# Patient Record
Sex: Female | Born: 2000 | Race: Black or African American | Hispanic: No | Marital: Single | Smoking: Never smoker
Health system: Southern US, Community
[De-identification: ages and names within clinical notes are randomized; demographics above are authoritative.]

## PROBLEM LIST (undated history)

## (undated) DIAGNOSIS — J45909 Unspecified asthma, uncomplicated: Secondary | ICD-10-CM

---

## 2020-01-17 ENCOUNTER — Ambulatory Visit (HOSPITAL_COMMUNITY)
Admission: EM | Admit: 2020-01-17 | Discharge: 2020-01-17 | Disposition: A | Discharge status: Home / Self Care | Payer: Commercial Managed Care - PPO | Attending: Family Medicine | Admitting: Family Medicine

## 2020-01-17 ENCOUNTER — Encounter (HOSPITAL_COMMUNITY): Payer: Self-pay | Admitting: Family Medicine

## 2020-01-17 ENCOUNTER — Other Ambulatory Visit: Payer: Self-pay

## 2020-01-17 DIAGNOSIS — R519 Headache, unspecified: Secondary | ICD-10-CM

## 2020-01-17 DIAGNOSIS — S0083XA Contusion of other part of head, initial encounter: Secondary | ICD-10-CM | POA: Diagnosis not present

## 2020-01-17 MED ORDER — IBUPROFEN 800 MG PO TABS: 800.0000 mg | ORAL_TABLET | Freq: Three times a day (TID) | ORAL | 0 refills | Status: DC

## 2020-01-17 MED ORDER — CYCLOBENZAPRINE HCL 5 MG PO TABS: 5.0000 mg | ORAL_TABLET | Freq: Every day | ORAL | 0 refills | Status: DC

## 2020-01-17 NOTE — Discharge Instructions (Signed)
I expect the headache and facial swelling to last about 5 days.  It should be getting better tomorrow and gradually fade.  If you find that the headache is worsening, you will need a CAT scan.  This can only be done in the emergency department.
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
## 2020-03-31 ENCOUNTER — Other Ambulatory Visit: Payer: Self-pay

## 2020-03-31 ENCOUNTER — Emergency Department (HOSPITAL_COMMUNITY)
Admission: EM | Admit: 2020-03-31 | Discharge: 2020-04-01 | Disposition: A | Discharge status: Home / Self Care | Payer: Commercial Managed Care - PPO | Attending: Emergency Medicine | Admitting: Emergency Medicine

## 2020-03-31 ENCOUNTER — Encounter (HOSPITAL_COMMUNITY): Payer: Self-pay | Admitting: Emergency Medicine

## 2020-03-31 DIAGNOSIS — Z5321 Procedure and treatment not carried out due to patient leaving prior to being seen by health care provider: Secondary | ICD-10-CM | POA: Diagnosis not present

## 2020-03-31 DIAGNOSIS — R0602 Shortness of breath: Secondary | ICD-10-CM | POA: Diagnosis not present

## 2020-03-31 HISTORY — DX: Unspecified asthma, uncomplicated: J45.909

## 2020-03-31 MED ORDER — ALBUTEROL SULFATE (2.5 MG/3ML) 0.083% IN NEBU: 5.0000 mg | INHALATION_SOLUTION | Freq: Once | RESPIRATORY_TRACT | Status: DC

## 2020-03-31 NOTE — ED Triage Notes (Signed)
Pt brought to ED by co workers for asthma attack, pt reports using MDI x 3 pta. Pt hyperventilating on arrival, after pt was able to speak c/o fullness in head, LCTA.

## 2020-04-01 ENCOUNTER — Encounter (HOSPITAL_COMMUNITY): Payer: Self-pay

## 2020-04-01 ENCOUNTER — Ambulatory Visit (INDEPENDENT_AMBULATORY_CARE_PROVIDER_SITE_OTHER)
Admission: EM | Admit: 2020-04-01 | Discharge: 2020-04-01 | Disposition: A | Discharge status: Home / Self Care | Payer: Commercial Managed Care - PPO | Source: Home / Self Care

## 2020-04-01 ENCOUNTER — Encounter (HOSPITAL_COMMUNITY): Payer: Self-pay | Admitting: Emergency Medicine

## 2020-04-01 ENCOUNTER — Emergency Department (HOSPITAL_COMMUNITY)
Admission: EM | Admit: 2020-04-01 | Discharge: 2020-04-02 | Disposition: A | Discharge status: Home / Self Care | Payer: Commercial Managed Care - PPO | Source: Home / Self Care | Attending: Emergency Medicine | Admitting: Emergency Medicine

## 2020-04-01 ENCOUNTER — Other Ambulatory Visit: Payer: Self-pay

## 2020-04-01 DIAGNOSIS — J3089 Other allergic rhinitis: Secondary | ICD-10-CM | POA: Diagnosis not present

## 2020-04-01 DIAGNOSIS — J4520 Mild intermittent asthma, uncomplicated: Secondary | ICD-10-CM

## 2020-04-01 DIAGNOSIS — J45909 Unspecified asthma, uncomplicated: Secondary | ICD-10-CM

## 2020-04-01 MED ORDER — FLUTICASONE PROPIONATE 50 MCG/ACT NA SUSP: 2.0000 | Freq: Every day | NASAL | 2 refills | Status: AC

## 2020-04-01 MED ORDER — ALBUTEROL SULFATE HFA 108 (90 BASE) MCG/ACT IN AERS
2.0000 | INHALATION_SPRAY | Freq: Once | RESPIRATORY_TRACT | Status: AC
  Administered 2020-04-01: 2 via RESPIRATORY_TRACT
  Filled 2020-04-01: qty 6.7

## 2020-04-01 MED ORDER — PREDNISONE 20 MG PO TABS: 20.0000 mg | ORAL_TABLET | Freq: Every day | ORAL | 0 refills | Status: DC

## 2020-04-01 NOTE — ED Provider Notes (Signed)
[facility]    CSN: [id number] Arrival date & time: 04/01/20  1014      History   Chief Complaint Chief Complaint  Patient presents with   Allergies    HPI [facility] is a 19 y.o. female. who presents saying her allergies have been acting up in the past week, but she has not been taking her allergy meds. Then last night her asthma got worse and went to ER, but left without being seen since she had waiting a long time. She has used her Albuterol inhaler last night and this am and feels is not helping much. She denies fever, chills, sweats, CP. Does feel a little SOB and does have a productive cough with clear mucous.     Past Medical History:  Diagnosis Date   Asthma     There are no problems to display for this patient.   History reviewed. No pertinent surgical history.  OB History   No obstetric history on file.      Home Medications    Prior to Admission medications   Medication Sig Start Date End Date Taking? Authorizing Provider  cyclobenzaprine (FLEXERIL) 5 MG tablet Take 1 tablet (5 mg total) by mouth at bedtime. 01/17/20   Elvina Sidle, MD  fluticasone (FLONASE) 50 MCG/ACT nasal spray Place 2 sprays into both nostrils daily. 04/01/20 06/17/20  Rodriguez-Southworth, Nettie Elm, PA-C  ibuprofen (ADVIL) 800 MG tablet Take 1 tablet (800 mg total) by mouth 3 (three) times daily. 01/17/20   Elvina Sidle, MD  predniSONE (DELTASONE) 20 MG tablet Take 1 tablet (20 mg total) by mouth daily with breakfast. 04/01/20   Rodriguez-Southworth, Nettie Elm, PA-C    Family History History reviewed. No pertinent family history.  Social History Social History   Tobacco Use   Smoking status: Never Smoker   Smokeless tobacco: Never Used  Substance Use Topics   Alcohol use: Yes   Drug use: Yes    Types: Marijuana     Allergies   Patient has no known allergies.   Review of Systems Review of Systems  Constitutional: Positive for fatigue. Negative for  appetite change, chills, diaphoresis and fever.  HENT: Positive for congestion, postnasal drip, rhinorrhea, sinus pressure and sneezing. Negative for ear discharge, ear pain, sinus pain and sore throat.   Eyes: Positive for itching. Negative for discharge.       Her eye lids have been swelling  Respiratory: Positive for cough, shortness of breath and wheezing. Negative for chest tightness.   Cardiovascular: Negative for chest pain.  Musculoskeletal: Negative for arthralgias, gait problem and myalgias.  Skin: Negative for rash.  Allergic/Immunologic: Positive for environmental allergies.  Neurological: Negative for headaches.  Hematological: Negative for adenopathy.     Physical Exam Triage Vital Signs ED Triage Vitals [04/01/20 1029]  Enc Vitals Group     BP 112/62     Pulse Rate 81     Resp 16     Temp 98.2 F (36.8 C)     Temp Source Oral     SpO2 100 %     Weight      Height      Head Circumference      Peak Flow      Pain Score 0     Pain Loc      Pain Edu?      Excl. in GC?    No data found.  Updated Vital Signs BP 112/62 (BP Location: Right Arm)    Pulse 81  Temp 98.2 F (36.8 C) (Oral)    Resp 16    LMP 03/29/2020    SpO2 100%   Visual Acuity Right Eye Distance:   Left Eye Distance:   Bilateral Distance:    Right Eye Near:   Left Eye Near:    Bilateral Near:     Physical Exam Vitals and nursing note reviewed.  Constitutional:      General: She is not in acute distress.    Appearance: She is normal weight. She is not toxic-appearing.  HENT:     Head: Normocephalic.     Right Ear: Tympanic membrane, ear canal and external ear normal.     Left Ear: Tympanic membrane, ear canal and external ear normal.     Nose: Congestion and rhinorrhea present.     Mouth/Throat:     Mouth: Mucous membranes are moist.     Pharynx: Oropharynx is clear.  Eyes:     General: Scleral icterus present.        Right eye: No discharge.        Left eye: No discharge.      Extraocular Movements: Extraocular movements intact.     Conjunctiva/sclera: Conjunctivae normal.     Pupils: Pupils are equal, round, and reactive to light.     Comments: Her upper lids are a little swollen  Cardiovascular:     Rate and Rhythm: Normal rate and regular rhythm.  Pulmonary:     Effort: Pulmonary effort is normal.     Breath sounds: Normal breath sounds. No wheezing.  Musculoskeletal:        General: Normal range of motion.     Cervical back: Neck supple.  Lymphadenopathy:     Cervical: No cervical adenopathy.  Skin:    General: Skin is warm and dry.  Neurological:     Mental Status: She is alert and oriented to person, place, and time.     Gait: Gait normal.  Psychiatric:        Mood and Affect: Mood normal.        Behavior: Behavior normal.        Thought Content: Thought content normal.        Judgment: Judgment normal.      UC Treatments / Results  Labs (all labs ordered are listed, but only abnormal results are displayed) Labs Reviewed - No data to display  EKG   Radiology No results found.  Procedures Procedures (including critical care time)  Medications Ordered in UC Medications - No data to display  Initial Impression / Assessment and Plan / UC Course  I have reviewed the triage vital signs and the nursing notes. She was encouraged to get back on her allergy meds and explained that her allergies seems to be triggering her Asthma. I placed her on prednisone and she may continue her Albuterol inhaler q4h. I also placed her on Flonse.  FU prn Final Clinical Impressions(s) / UC Diagnoses   Final diagnoses:  Seasonal allergic rhinitis due to other allergic trigger  Mild asthma without complication, unspecified whether persistent     Discharge Instructions     Get back on your allergy medication today plus take what I am prescribing. Use your inhaler 2 puffs every 4 hours for 5-7 days, then as needed.  If you get worse in the next 72h, then  come back to be seen again.  Your allergies are triggering your asthma    ED Prescriptions    Medication Sig Dispense  Auth. Provider   predniSONE (DELTASONE) 20 MG tablet Take 1 tablet (20 mg total) by mouth daily with breakfast. 5 tablet Rodriguez-Southworth, Larie Mathes, PA-C   fluticasone (FLONASE) 50 MCG/ACT nasal spray Place 2 sprays into both nostrils daily. 18.2 g Rodriguez-Southworth, Nettie Elm, PA-C     PDMP not reviewed this encounter.   Garey Ham, PA-C 04/01/20 1130

## 2020-04-01 NOTE — ED Triage Notes (Signed)
Patient with asthma having problems breathing.  She states that she went to [facility] today and they told her that she was having some trouble due to her allergies.  Patient gave her meds for her allergies and asthma but she didn't take it because she hasn't eaten and she didn't want to vomit in her sleep.  Patient speaking in full sentences.

## 2020-04-01 NOTE — Discharge Instructions (Addendum)
Get back on your allergy medication today plus take what I am prescribing. Use your inhaler 2 puffs every 4 hours for 5-7 days, then as needed.  If you get worse in the next 72h, then come back to be seen again.  Your allergies are triggering your asthma

## 2020-04-01 NOTE — ED Triage Notes (Signed)
Pt presents today for "heavy head", dizziness, and eye puffiness. Pt also c/o sore throat. Pt endorses seasonal allergies. Pt denies taking allergy meds over the last month. Pt states "I still have them I just don' take them.". Pt denies other OTC remedies or relieving factors. Pt denies n/v/d, shortness of breath, fevers, chills, body aches.

## 2020-04-02 ENCOUNTER — Encounter (HOSPITAL_COMMUNITY): Payer: Self-pay | Admitting: Emergency Medicine

## 2020-04-02 NOTE — ED Notes (Signed)
Patient verbalizes understanding of discharge instructions. Opportunity for questioning and answers were provided. Armband removed by staff, pt discharged from ED. Pt. ambulatory and discharged home.  

## 2020-04-02 NOTE — ED Provider Notes (Signed)
[facility] Provider Note   CSN: [id number] Arrival date & time: 04/01/20  2313     History Chief Complaint  Patient presents with  . Asthma    [facility] is a 19 y.o. female.   Asthma This is a recurrent problem. The current episode started 12 to 24 hours ago. The problem occurs constantly. The problem has been rapidly improving. Pertinent negatives include no chest pain, no abdominal pain, no headaches and no shortness of breath. Nothing aggravates the symptoms. Nothing relieves the symptoms. She has tried nothing for the symptoms. The treatment provided no relief.  Returned to the ED as she did not take the steroids that were ordered by urgent care.  States she was wheezing and afraid to have an attack in her sleep.  Given one puff in triage.      Past Medical History:  Diagnosis Date  . Asthma     There are no problems to display for this patient.   History reviewed. No pertinent surgical history.   OB History   No obstetric history on file.     History reviewed. No pertinent family history.  Social History   Tobacco Use  . Smoking status: Never Smoker  . Smokeless tobacco: Never Used  Substance Use Topics  . Alcohol use: Yes  . Drug use: Yes    Types: Marijuana    Home Medications Prior to Admission medications   Medication Sig Start Date End Date Taking? Authorizing Provider  albuterol (VENTOLIN HFA) 108 (90 Base) MCG/ACT inhaler Inhale 2-3 puffs into the lungs every 6 (six) hours as needed for wheezing or shortness of breath.   Yes [provider]  fluticasone (FLONASE) 50 MCG/ACT nasal spray Place 2 sprays into both nostrils daily. 04/01/20 06/17/20 Yes [facility], Cassidy Elm, PA-C  ibuprofen (ADVIL) 200 MG tablet Take 600 mg by mouth every 6 (six) hours as needed.   Yes [provider]  cyclobenzaprine (FLEXERIL) 5 MG tablet Take 1 tablet (5 mg total) by mouth at bedtime. Patient not  taking: Reported on 04/02/2020 01/17/20   Cassidy Sidle, MD  ibuprofen (ADVIL) 800 MG tablet Take 1 tablet (800 mg total) by mouth 3 (three) times daily. Patient not taking: Reported on 04/02/2020 01/17/20   Cassidy Sidle, MD  predniSONE (DELTASONE) 20 MG tablet Take 1 tablet (20 mg total) by mouth daily with breakfast. 04/01/20   [facility], Cassidy Elm, PA-C    Allergies    Patient has no known allergies.  Review of Systems   Review of Systems  Constitutional: Negative for fever.  HENT: Negative for congestion.   Eyes: Negative for visual disturbance.  Respiratory: Positive for wheezing. Negative for shortness of breath.   Cardiovascular: Negative for chest pain.  Gastrointestinal: Negative for abdominal pain.  Genitourinary: Negative for difficulty urinating.  Musculoskeletal: Negative for arthralgias.  Skin: Negative for rash.  Neurological: Negative for headaches.  Psychiatric/Behavioral: Negative for agitation.  All other systems reviewed and are negative.   Physical Exam Updated Vital Signs BP 114/90 (BP Location: Left Arm)   Pulse 85   Temp 99.3 F (37.4 C) (Oral)   Resp 16   LMP 03/29/2020   SpO2 100%   Physical Exam Vitals and nursing note reviewed.  Constitutional:      General: She is not in acute distress.    Appearance: Normal appearance.  HENT:     Head: Normocephalic and atraumatic.     Nose: Nose normal.  Eyes:  Conjunctiva/sclera: Conjunctivae normal.     Pupils: Pupils are equal, round, and reactive to light.  Cardiovascular:     Rate and Rhythm: Normal rate and regular rhythm.     Pulses: Normal pulses.     Heart sounds: Normal heart sounds.  Pulmonary:     Effort: Pulmonary effort is normal. No respiratory distress.     Breath sounds: Normal breath sounds. No wheezing or rales.  Abdominal:     General: Bowel sounds are normal.     Tenderness: There is no abdominal tenderness. There is no guarding.  Musculoskeletal:        General:  Normal range of motion.     Cervical back: Neck supple.  Skin:    General: Skin is warm and dry.     Capillary Refill: Capillary refill takes less than 2 seconds.  Neurological:     General: No focal deficit present.     Mental Status: She is alert and oriented to person, place, and time.     Deep Tendon Reflexes: Reflexes normal.  Psychiatric:        Mood and Affect: Mood normal.        Behavior: Behavior normal.     ED Results / Procedures / Treatments   Labs (all labs ordered are listed, but only abnormal results are displayed) Labs Reviewed - No data to display  EKG None  Radiology No results found.  Procedures Procedures (including critical care time)  Medications Ordered in ED Medications  albuterol (VENTOLIN HFA) 108 (90 Base) MCG/ACT inhaler 2 puff (2 puffs Inhalation Given 04/01/20 2329)    ED Course  I have reviewed the triage vital signs and the nursing notes.  Pertinent labs & imaging results that were available during my care of the patient were reviewed by me and considered in my medical decision making (see chart for details).    Lungs are clear, given an inhaler.  Saturation 100%.  fILL AND TAKE STEROIDS prescribed by urgent care.    [facility] was evaluated in Emergency Department on 04/02/2020 for the symptoms described in the history of present illness. She was evaluated in the context of the global COVID-19 pandemic, which necessitated consideration that the patient might be at risk for infection with the SARS-CoV-2 virus that causes COVID-19. Institutional protocols and algorithms that pertain to the evaluation of patients at risk for COVID-19 are in a state of rapid change based on information released by regulatory bodies including the CDC and federal and state organizations. These policies and algorithms were followed during the patient's care in the ED.  Final Clinical Impression(s) / ED Diagnoses Return for intractable cough, coughing up  blood,fevers >100.4 unrelieved by medication, shortness of breath, intractable vomiting, chest pain, shortness of breath, weakness,numbness, changes in speech, facial asymmetry,abdominal pain, passing out,Inability to tolerate liquids or food, cough, altered mental status or any concerns. No signs of systemic illness or infection. The patient is nontoxic-appearing on exam and vital signs are within normal limits.   I have reviewed the triage vital signs and the nursing notes. Pertinent labs &imaging results that were available during my care of the patient were reviewed by me and considered in my medical decision making (see chart for details).After history, exam, and medical workup I feel the patient has beenappropriately medically screened and is safe for discharge home. Pertinent diagnoses were discussed with the patient. Patient was given return precautions.    Venetta Knee, MD 04/02/20 [phone]

## 2020-06-16 ENCOUNTER — Ambulatory Visit (HOSPITAL_COMMUNITY)
Admission: EM | Admit: 2020-06-16 | Discharge: 2020-06-16 | Disposition: A | Discharge status: Home / Self Care | Payer: Commercial Managed Care - PPO | Attending: Family Medicine | Admitting: Family Medicine

## 2020-06-16 ENCOUNTER — Other Ambulatory Visit: Payer: Self-pay

## 2020-06-16 ENCOUNTER — Encounter (HOSPITAL_COMMUNITY): Payer: Self-pay | Admitting: Emergency Medicine

## 2020-06-16 DIAGNOSIS — Z3202 Encounter for pregnancy test, result negative: Secondary | ICD-10-CM

## 2020-06-16 DIAGNOSIS — N898 Other specified noninflammatory disorders of vagina: Secondary | ICD-10-CM | POA: Diagnosis not present

## 2020-06-16 LAB — POCT URINALYSIS DIPSTICK, ED / UC
Bilirubin Urine: NEGATIVE
Glucose, UA: NEGATIVE mg/dL
Hgb urine dipstick: NEGATIVE
Ketones, ur: NEGATIVE mg/dL
Nitrite: NEGATIVE
Protein, ur: NEGATIVE mg/dL
Specific Gravity, Urine: 1.025 (ref 1.005–1.030)
Urobilinogen, UA: 1 mg/dL (ref 0.0–1.0)
pH: 7 (ref 5.0–8.0)

## 2020-06-16 LAB — HIV ANTIBODY (ROUTINE TESTING W REFLEX): HIV Screen 4th Generation wRfx: NONREACTIVE

## 2020-06-16 LAB — POC URINE PREG, ED: Preg Test, Ur: NEGATIVE

## 2020-06-16 NOTE — ED Notes (Signed)
Tried calling pt on phone to let know room was ready, no answer, unable to LVM.

## 2020-06-16 NOTE — ED Notes (Signed)
Called x1

## 2020-06-16 NOTE — ED Triage Notes (Signed)
Pt presents for STD testing. States has "tingle with urination" and some discomfort

## 2020-06-16 NOTE — Discharge Instructions (Signed)
We have tested you for STDs. We will call you with any positive results. Urine did not show any infection today. Follow up as needed for continued or worsening symptoms

## 2020-06-17 ENCOUNTER — Telehealth (HOSPITAL_COMMUNITY): Payer: Self-pay | Admitting: Emergency Medicine

## 2020-06-17 LAB — CERVICOVAGINAL ANCILLARY ONLY
Bacterial Vaginitis (gardnerella): NEGATIVE
Candida Glabrata: NEGATIVE
Candida Vaginitis: POSITIVE — AB
Chlamydia: NEGATIVE
Comment: NEGATIVE
Comment: NEGATIVE
Comment: NEGATIVE
Comment: NEGATIVE
Comment: NEGATIVE
Comment: NORMAL
Neisseria Gonorrhea: NEGATIVE
Trichomonas: POSITIVE — AB

## 2020-06-17 LAB — RPR: RPR Ser Ql: NONREACTIVE

## 2020-06-17 MED ORDER — FLUCONAZOLE 150 MG PO TABS: 150.0000 mg | ORAL_TABLET | Freq: Once | ORAL | 0 refills | Status: AC

## 2020-06-17 MED ORDER — METRONIDAZOLE 500 MG PO TABS: 500.0000 mg | ORAL_TABLET | Freq: Two times a day (BID) | ORAL | 0 refills | Status: DC

## 2020-06-17 NOTE — ED Provider Notes (Signed)
[facility]    CSN: [id number] Arrival date & time: 06/16/20  0900      History   Chief Complaint No chief complaint on file.   HPI Cassidy Lawrence is a 19 y.o. female.   Patient is a 20 year old female who presents today for possible STD.  Reporting some tingling with urination.  Denies any specific discharge.  Denies any urinary frequency or hematuria.  Currently sexually active, unprotected. Patient's last menstrual period was 05/30/2020.      Past Medical History:  Diagnosis Date  . Asthma     There are no problems to display for this patient.   History reviewed. No pertinent surgical history.  OB History   No obstetric history on file.      Home Medications    Prior to Admission medications   Medication Sig Start Date End Date Taking? Authorizing Provider  albuterol (VENTOLIN HFA) 108 (90 Base) MCG/ACT inhaler Inhale 2-3 puffs into the lungs every 6 (six) hours as needed for wheezing or shortness of breath.    [provider]  cyclobenzaprine (FLEXERIL) 5 MG tablet Take 1 tablet (5 mg total) by mouth at bedtime. Patient not taking: Reported on 04/02/2020 01/17/20   Elvina Sidle, MD  fluticasone [facility]) 50 MCG/ACT nasal spray Place 2 sprays into both nostrils daily. 04/01/20 06/17/20  Rodriguez-Southworth, Nettie Elm, PA-C  ibuprofen (ADVIL) 200 MG tablet Take 600 mg by mouth every 6 (six) hours as needed.    [provider]  ibuprofen (ADVIL) 800 MG tablet Take 1 tablet (800 mg total) by mouth 3 (three) times daily. Patient not taking: Reported on 04/02/2020 01/17/20   Elvina Sidle, MD  predniSONE (DELTASONE) 20 MG tablet Take 1 tablet (20 mg total) by mouth daily with breakfast. 04/01/20   Rodriguez-Southworth, Nettie Elm, PA-C    Family History History reviewed. No pertinent family history.  Social History Social History   Tobacco Use  . Smoking status: Never Smoker  . Smokeless tobacco: Never Used  Substance Use Topics  .  Alcohol use: Yes  . Drug use: Yes    Types: Marijuana     Allergies   Patient has no known allergies.   Review of Systems Review of Systems   Physical Exam Triage Vital Signs ED Triage Vitals  Enc Vitals Group     BP 06/16/20 1057 116/81     Pulse Rate 06/16/20 1057 76     Resp 06/16/20 1057 18     Temp 06/16/20 1057 98.8 F (37.1 C)     Temp Source 06/16/20 1057 Oral     SpO2 06/16/20 1057 99 %     Weight --      Height --      Head Circumference --      Peak Flow --      Pain Score 06/16/20 1054 0     Pain Loc --      Pain Edu? --      Excl. in GC? --    No data found.  Updated Vital Signs BP 116/81 (BP Location: Right Arm)   Pulse 76   Temp 98.8 F (37.1 C) (Oral)   Resp 18   LMP 05/30/2020   SpO2 99%   Visual Acuity Right Eye Distance:   Left Eye Distance:   Bilateral Distance:    Right Eye Near:   Left Eye Near:    Bilateral Near:     Physical Exam Vitals and nursing note reviewed.  Constitutional:  General: She is not in acute distress.    Appearance: Normal appearance. She is not ill-appearing, toxic-appearing or diaphoretic.  HENT:     Head: Normocephalic.     Nose: Nose normal.  Eyes:     Conjunctiva/sclera: Conjunctivae normal.  Pulmonary:     Effort: Pulmonary effort is normal.  Musculoskeletal:        General: Normal range of motion.     Cervical back: Normal range of motion.  Skin:    General: Skin is warm and dry.     Findings: No rash.  Neurological:     Mental Status: She is alert.  Psychiatric:        Mood and Affect: Mood normal.      UC Treatments / Results  Labs (all labs ordered are listed, but only abnormal results are displayed) Labs Reviewed  POCT URINALYSIS DIPSTICK, ED / UC - Abnormal; Notable for the following components:      Result Value   Leukocytes,Ua SMALL (*)    All other components within normal limits  CERVICOVAGINAL ANCILLARY ONLY - Abnormal; Notable for the following components:    Trichomonas Positive (*)    Candida Vaginitis Positive (*)    All other components within normal limits  URINE CULTURE  HIV ANTIBODY (ROUTINE TESTING W REFLEX)  RPR  POC URINE PREG, ED    EKG   Radiology No results found.  Procedures Procedures (including critical care time)  Medications Ordered in UC Medications - No data to display  Initial Impression / Assessment and Plan / UC Course  I have reviewed the triage vital signs and the nursing notes.  Pertinent labs & imaging results that were available during my care of the patient were reviewed by me and considered in my medical decision making (see chart for details).     Vaginal irritation Swab sent for STD screening. Urine did not show any signs of infection today.  Sending for culture. Follow up as needed for continued or worsening symptoms  Final Clinical Impressions(s) / UC Diagnoses   Final diagnoses:  Vaginal irritation     Discharge Instructions     We have tested you for STDs. We will call you with any positive results. Urine did not show any infection today. Follow up as needed for continued or worsening symptoms     ED Prescriptions    None     PDMP not reviewed this encounter.   Janace Aris, NP 06/17/20 1233

## 2020-06-18 LAB — URINE CULTURE: Culture: 10000 — AB

## 2020-07-05 ENCOUNTER — Encounter (HOSPITAL_COMMUNITY): Payer: Self-pay | Admitting: *Deleted

## 2020-07-05 ENCOUNTER — Ambulatory Visit (HOSPITAL_COMMUNITY)
Admission: EM | Admit: 2020-07-05 | Discharge: 2020-07-05 | Disposition: A | Discharge status: Home / Self Care | Payer: Commercial Managed Care - PPO | Attending: Internal Medicine | Admitting: Internal Medicine

## 2020-07-05 ENCOUNTER — Other Ambulatory Visit: Payer: Self-pay

## 2020-07-05 DIAGNOSIS — J029 Acute pharyngitis, unspecified: Secondary | ICD-10-CM | POA: Diagnosis not present

## 2020-07-05 DIAGNOSIS — Z8619 Personal history of other infectious and parasitic diseases: Secondary | ICD-10-CM

## 2020-07-05 DIAGNOSIS — Z20822 Contact with and (suspected) exposure to covid-19: Secondary | ICD-10-CM | POA: Insufficient documentation

## 2020-07-05 DIAGNOSIS — Z8742 Personal history of other diseases of the female genital tract: Secondary | ICD-10-CM

## 2020-07-05 LAB — SARS CORONAVIRUS 2 (TAT 6-24 HRS): SARS Coronavirus 2: NEGATIVE

## 2020-07-05 LAB — POCT RAPID STREP A, ED / UC: Streptococcus, Group A Screen (Direct): NEGATIVE

## 2020-07-05 MED ORDER — CEFTRIAXONE SODIUM 500 MG IJ SOLR
INTRAMUSCULAR | Status: AC
  Filled 2020-07-05: qty 500

## 2020-07-05 MED ORDER — CEFTRIAXONE SODIUM 500 MG IJ SOLR
500.0000 mg | Freq: Once | INTRAMUSCULAR | Status: AC
  Administered 2020-07-05: 500 mg via INTRAMUSCULAR

## 2020-07-05 MED ORDER — LIDOCAINE HCL (PF) 1 % IJ SOLN
INTRAMUSCULAR | Status: AC
  Filled 2020-07-05: qty 2

## 2020-07-05 NOTE — ED Triage Notes (Addendum)
Patient in with complaints of sore throat x 2 days. Patient denies fever or other symptoms. No over the counter medications taken at home. Patient also concerned that symptoms started after performing oral sex with partner.

## 2020-07-05 NOTE — ED Provider Notes (Signed)
[facility]   MRN: [id number] DOB: May 02, 2001  Subjective:   Cassidy Lawrence is a 19 y.o. female presenting for 2 day hx of acute onset throat pain, swelling. Has sex with multiple partners, females and males. Has had trichomoniasis, yeast vaginitis 06/16/2020.   No current facility-administered medications for this encounter.  Current Outpatient Medications:  .  albuterol (VENTOLIN HFA) 108 (90 Base) MCG/ACT inhaler, Inhale 2-3 puffs into the lungs every 6 (six) hours as needed for wheezing or shortness of breath., Disp: , Rfl:  .  ibuprofen (ADVIL) 800 MG tablet, Take 1 tablet (800 mg total) by mouth 3 (three) times daily., Disp: 21 tablet, Rfl: 0 .  cyclobenzaprine (FLEXERIL) 5 MG tablet, Take 1 tablet (5 mg total) by mouth at bedtime. (Patient not taking: Reported on 04/02/2020), Disp: 7 tablet, Rfl: 0 .  fluticasone (FLONASE) 50 MCG/ACT nasal spray, Place 2 sprays into both nostrils daily., Disp: 18.2 g, Rfl: 2 .  ibuprofen (ADVIL) 200 MG tablet, Take 600 mg by mouth every 6 (six) hours as needed., Disp: , Rfl:  .  metroNIDAZOLE (FLAGYL) 500 MG tablet, Take 1 tablet (500 mg total) by mouth 2 (two) times daily., Disp: 14 tablet, Rfl: 0 .  predniSONE (DELTASONE) 20 MG tablet, Take 1 tablet (20 mg total) by mouth daily with breakfast., Disp: 5 tablet, Rfl: 0   No Known Allergies  Past Medical History:  Diagnosis Date  . Asthma      History reviewed. No pertinent surgical history.  History reviewed. No pertinent family history.  Social History   Tobacco Use  . Smoking status: Never Smoker  . Smokeless tobacco: Never Used  Substance Use Topics  . Alcohol use: Yes  . Drug use: Yes    Types: Marijuana    ROS   Objective:   Vitals: BP (!) 143/78 (BP Location: Left Arm)   Pulse 96   Temp 99.6 F (37.6 C) (Oral)   Resp 16   LMP 06/30/2020   SpO2 100%   Physical Exam Constitutional:      General: She is not in acute distress.    Appearance: Normal  appearance. She is well-developed. She is not ill-appearing, toxic-appearing or diaphoretic.  HENT:     Head: Normocephalic and atraumatic.     Nose: Nose normal.     Mouth/Throat:     Mouth: Mucous membranes are moist.     Pharynx: Oropharyngeal exudate and posterior oropharyngeal erythema present. No pharyngeal swelling or uvula swelling.     Tonsils: Tonsillar exudate present. No tonsillar abscesses. 1+ on the right. 1+ on the left.  Eyes:     General: No scleral icterus.    Extraocular Movements: Extraocular movements intact.     Pupils: Pupils are equal, round, and reactive to light.  Cardiovascular:     Rate and Rhythm: Normal rate.  Pulmonary:     Effort: Pulmonary effort is normal.  Skin:    General: Skin is warm and dry.  Neurological:     General: No focal deficit present.     Mental Status: She is alert and oriented to person, place, and time.  Psychiatric:        Mood and Affect: Mood normal.        Behavior: Behavior normal.     Results for orders placed or performed during the hospital encounter of 07/05/20 (from the past 24 hour(s))  POCT Rapid Strep A     Status: None   Collection Time:  07/05/20  9:04 AM  Result Value Ref Range   Streptococcus, Group A Screen (Direct) NEGATIVE NEGATIVE    Assessment and Plan :   PDMP not reviewed this encounter.  1. Sore throat   2. History of trichomoniasis   3. History of vaginitis     Patient is very high risk given her sex life.  Recommended IM ceftriaxone for empiric treatment especially given throat findings, hold off on additional medication notes pending lab results.  COVID-19, strep culture pending as well. Counseled patient on potential for adverse effects with medications prescribed/recommended today, ER and return-to-clinic precautions discussed, patient verbalized understanding.    Wallis Bamberg, [place] 07/05/20 [phone]

## 2020-07-05 NOTE — Discharge Instructions (Signed)
Avoid all forms of sexual intercourse (oral, vaginal, anal) for the next 7 days to avoid spreading/reinfecting. Return if symptoms worsen/do not resolve, you develop fever, abdominal pain, blood in your urine, or are re-exposed to an STI.  I will let you know about your test results as soon as they are available.

## 2020-07-06 LAB — CYTOLOGY, (ORAL, ANAL, URETHRAL) ANCILLARY ONLY
Chlamydia: NEGATIVE
Comment: NEGATIVE
Comment: NEGATIVE
Comment: NORMAL
Neisseria Gonorrhea: NEGATIVE
Trichomonas: NEGATIVE

## 2020-07-06 LAB — CERVICOVAGINAL ANCILLARY ONLY
Bacterial Vaginitis (gardnerella): NEGATIVE
Candida Glabrata: NEGATIVE
Candida Vaginitis: NEGATIVE
Chlamydia: NEGATIVE
Comment: NEGATIVE
Comment: NEGATIVE
Comment: NEGATIVE
Comment: NEGATIVE
Comment: NEGATIVE
Comment: NORMAL
Neisseria Gonorrhea: NEGATIVE
Trichomonas: NEGATIVE

## 2020-07-08 LAB — CULTURE, GROUP A STREP (THRC)

## 2020-09-30 ENCOUNTER — Other Ambulatory Visit: Payer: Self-pay

## 2020-09-30 ENCOUNTER — Encounter (HOSPITAL_COMMUNITY): Payer: Self-pay | Admitting: Emergency Medicine

## 2020-09-30 ENCOUNTER — Ambulatory Visit (HOSPITAL_COMMUNITY)
Admission: EM | Admit: 2020-09-30 | Discharge: 2020-09-30 | Disposition: A | Discharge status: Home / Self Care | Payer: Commercial Managed Care - PPO | Attending: Internal Medicine | Admitting: Internal Medicine

## 2020-09-30 DIAGNOSIS — N760 Acute vaginitis: Secondary | ICD-10-CM | POA: Diagnosis not present

## 2020-09-30 LAB — POCT URINALYSIS DIPSTICK, ED / UC
Bilirubin Urine: NEGATIVE
Glucose, UA: NEGATIVE mg/dL
Hgb urine dipstick: NEGATIVE
Leukocytes,Ua: NEGATIVE
Nitrite: NEGATIVE
Protein, ur: NEGATIVE mg/dL
Specific Gravity, Urine: 1.025 (ref 1.005–1.030)
Urobilinogen, UA: 1 mg/dL (ref 0.0–1.0)
pH: 7.5 (ref 5.0–8.0)

## 2020-09-30 LAB — POC URINE PREG, ED: Preg Test, Ur: NEGATIVE

## 2020-09-30 MED ORDER — FLUORESCEIN SODIUM 1 MG OP STRP
ORAL_STRIP | OPHTHALMIC | Status: AC
  Filled 2020-09-30: qty 1

## 2020-09-30 MED ORDER — TETRACAINE HCL 0.5 % OP SOLN
OPHTHALMIC | Status: AC
  Filled 2020-09-30: qty 4

## 2020-09-30 MED ORDER — EYE WASH OPHTH SOLN
OPHTHALMIC | Status: AC
  Filled 2020-09-30: qty 118

## 2020-09-30 NOTE — ED Triage Notes (Signed)
PT C/O: here for STD checking.... reports vaginal pain associated w/a tan discharge   Sexually active w/mult females   A&O x4... NAD... Ambulatory

## 2020-09-30 NOTE — Discharge Instructions (Addendum)
We will call you with recommendations if your results are abnormal 

## 2020-09-30 NOTE — ED Provider Notes (Signed)
[facility]    CSN: [id number] Arrival date & time: 09/30/20  4627      History   Chief Complaint Chief Complaint  Patient presents with  . Exposure to STD    HPI Cassidy Lawrence is a 19 y.o. female comes to the urgent care requesting STD screening.  Patient comes to the urgent care complaining of tan colored vaginal discharge.  She recently changed sexual partners.  She currently engaged in unprotected sexual intercourse with the same sex partner.  No dysuria urgency or frequency.  No fever or chills.   HPI  Past Medical History:  Diagnosis Date  . Asthma     There are no problems to display for this patient.   History reviewed. No pertinent surgical history.  OB History   No obstetric history on file.      Home Medications    Prior to Admission medications   Medication Sig Start Date End Date Taking? Authorizing Provider  albuterol (VENTOLIN HFA) 108 (90 Base) MCG/ACT inhaler Inhale 2-3 puffs into the lungs every 6 (six) hours as needed for wheezing or shortness of breath.   Yes [provider]  ibuprofen (ADVIL) 200 MG tablet Take 600 mg by mouth every 6 (six) hours as needed.   Yes [provider]  fluticasone (FLONASE) 50 MCG/ACT nasal spray Place 2 sprays into both nostrils daily. 04/01/20 06/17/20  Rodriguez-Southworth, Nettie Elm, PA-C  ibuprofen (ADVIL) 800 MG tablet Take 1 tablet (800 mg total) by mouth 3 (three) times daily. 01/17/20   Elvina Sidle, MD  metroNIDAZOLE (FLAGYL) 500 MG tablet Take 1 tablet (500 mg total) by mouth 2 (two) times daily. 06/17/20   Merrilee Jansky, MD  predniSONE (DELTASONE) 20 MG tablet Take 1 tablet (20 mg total) by mouth daily with breakfast. 04/01/20   Rodriguez-Southworth, Nettie Elm, PA-C    Family History History reviewed. No pertinent family history.  Social History Social History   Tobacco Use  . Smoking status: Never Smoker  . Smokeless tobacco: Never Used  Substance Use Topics  . Alcohol  use: Yes  . Drug use: Yes    Types: Marijuana     Allergies   Patient has no known allergies.   Review of Systems Review of Systems  Constitutional: Negative.   HENT: Negative.   Genitourinary: Positive for vaginal discharge. Negative for dysuria, frequency, hematuria, urgency, vaginal bleeding and vaginal pain.     Physical Exam Triage Vital Signs ED Triage Vitals  Enc Vitals Group     BP 09/30/20 1032 111/69     Pulse Rate 09/30/20 1032 79     Resp 09/30/20 1032 16     Temp 09/30/20 1032 98.5 F (36.9 C)     Temp Source 09/30/20 1032 Oral     SpO2 09/30/20 1032 97 %     Weight --      Height --      Head Circumference --      Peak Flow --      Pain Score 09/30/20 1029 2     Pain Loc --      Pain Edu? --      Excl. in GC? --    No data found.  Updated Vital Signs BP 111/69 (BP Location: Right Arm)   Pulse 79   Temp 98.5 F (36.9 C) (Oral)   Resp 16   LMP 09/18/2020   SpO2 97%   Visual Acuity Right Eye Distance:   Left Eye Distance:   Bilateral  Distance:    Right Eye Near:   Left Eye Near:    Bilateral Near:     Physical Exam Vitals and nursing note reviewed.  Constitutional:      General: She is not in acute distress.    Appearance: She is not ill-appearing.  Cardiovascular:     Rate and Rhythm: Normal rate and regular rhythm.     Pulses: Normal pulses.     Heart sounds: Normal heart sounds.  Pulmonary:     Effort: Pulmonary effort is normal.     Breath sounds: Normal breath sounds.  Neurological:     Mental Status: She is alert.      UC Treatments / Results  Labs (all labs ordered are listed, but only abnormal results are displayed) Labs Reviewed  POCT URINALYSIS DIPSTICK, ED / UC - Abnormal; Notable for the following components:      Result Value   Ketones, ur TRACE (*)    All other components within normal limits  POC URINE PREG, ED  CERVICOVAGINAL ANCILLARY ONLY    EKG   Radiology No results  found.  Procedures Procedures (including critical care time)  Medications Ordered in UC Medications - No data to display  Initial Impression / Assessment and Plan / UC Course  I have reviewed the triage vital signs and the nursing notes.  Pertinent labs & imaging results that were available during my care of the patient were reviewed by me and considered in my medical decision making (see chart for details).    1.  Acute vaginitis: Point-of-care urinalysis is unremarkable Cervical vaginal swab for GC/chlamydia/trichomonas/bacterial vaginosis/vaginal yeast We will call patient with recommendations if labs are abnormal Safe sex practices advised Patient advised to abstain from sexual intercourse until STD lab results are available. Final Clinical Impressions(s) / UC Diagnoses   Final diagnoses:  Acute vaginitis     Discharge Instructions     We will call you with recommendations if your results are abnormal.   ED Prescriptions    None     PDMP not reviewed this encounter.   Merrilee Jansky, MD 09/30/20 1224

## 2020-10-03 LAB — CERVICOVAGINAL ANCILLARY ONLY
Bacterial Vaginitis (gardnerella): NEGATIVE
Candida Glabrata: NEGATIVE
Candida Vaginitis: POSITIVE — AB
Chlamydia: NEGATIVE
Comment: NEGATIVE
Comment: NEGATIVE
Comment: NEGATIVE
Comment: NEGATIVE
Comment: NEGATIVE
Comment: NORMAL
Neisseria Gonorrhea: NEGATIVE
Trichomonas: NEGATIVE

## 2020-10-07 ENCOUNTER — Telehealth (HOSPITAL_COMMUNITY): Payer: Self-pay

## 2020-10-07 MED ORDER — FLUCONAZOLE 150 MG PO TABS: 150.0000 mg | ORAL_TABLET | Freq: Every day | ORAL | 0 refills | Status: AC

## 2021-03-15 ENCOUNTER — Emergency Department (HOSPITAL_COMMUNITY): Payer: Commercial Managed Care - PPO

## 2021-03-15 ENCOUNTER — Encounter (HOSPITAL_COMMUNITY): Payer: Self-pay | Admitting: Pharmacy Technician

## 2021-03-15 ENCOUNTER — Inpatient Hospital Stay (HOSPITAL_COMMUNITY)
Admission: EM | Admit: 2021-03-15 | Discharge: 2021-03-17 | DRG: 872 | Discharge status: Against Medical Advice | Payer: Commercial Managed Care - PPO | Attending: Internal Medicine | Admitting: Internal Medicine

## 2021-03-15 DIAGNOSIS — N12 Tubulo-interstitial nephritis, not specified as acute or chronic: Secondary | ICD-10-CM

## 2021-03-15 DIAGNOSIS — Z20822 Contact with and (suspected) exposure to covid-19: Secondary | ICD-10-CM | POA: Diagnosis present

## 2021-03-15 DIAGNOSIS — E872 Acidosis: Secondary | ICD-10-CM | POA: Diagnosis present

## 2021-03-15 DIAGNOSIS — R079 Chest pain, unspecified: Secondary | ICD-10-CM

## 2021-03-15 DIAGNOSIS — E876 Hypokalemia: Secondary | ICD-10-CM | POA: Diagnosis present

## 2021-03-15 DIAGNOSIS — R10823 Right lower quadrant rebound abdominal tenderness: Secondary | ICD-10-CM

## 2021-03-15 DIAGNOSIS — R509 Fever, unspecified: Secondary | ICD-10-CM

## 2021-03-15 DIAGNOSIS — A419 Sepsis, unspecified organism: Secondary | ICD-10-CM | POA: Diagnosis not present

## 2021-03-15 DIAGNOSIS — F1210 Cannabis abuse, uncomplicated: Secondary | ICD-10-CM | POA: Diagnosis present

## 2021-03-15 DIAGNOSIS — Z79899 Other long term (current) drug therapy: Secondary | ICD-10-CM

## 2021-03-15 DIAGNOSIS — R1011 Right upper quadrant pain: Secondary | ICD-10-CM

## 2021-03-15 DIAGNOSIS — Z5329 Procedure and treatment not carried out because of patient's decision for other reasons: Secondary | ICD-10-CM | POA: Diagnosis present

## 2021-03-15 DIAGNOSIS — F1010 Alcohol abuse, uncomplicated: Secondary | ICD-10-CM | POA: Diagnosis present

## 2021-03-15 DIAGNOSIS — J45909 Unspecified asthma, uncomplicated: Secondary | ICD-10-CM | POA: Diagnosis present

## 2021-03-15 LAB — CBC WITH DIFFERENTIAL/PLATELET
Abs Immature Granulocytes: 0.03 10*3/uL (ref 0.00–0.07)
Basophils Absolute: 0 10*3/uL (ref 0.0–0.1)
Basophils Relative: 0 %
Eosinophils Absolute: 0 10*3/uL (ref 0.0–0.5)
Eosinophils Relative: 0 %
HCT: 39.3 % (ref 36.0–46.0)
Hemoglobin: 13.2 g/dL (ref 12.0–15.0)
Immature Granulocytes: 0 %
Lymphocytes Relative: 12 %
Lymphs Abs: 1 10*3/uL (ref 0.7–4.0)
MCH: 28.8 pg (ref 26.0–34.0)
MCHC: 33.6 g/dL (ref 30.0–36.0)
MCV: 85.6 fL (ref 80.0–100.0)
Monocytes Absolute: 0.6 10*3/uL (ref 0.1–1.0)
Monocytes Relative: 8 %
Neutro Abs: 6.6 10*3/uL (ref 1.7–7.7)
Neutrophils Relative %: 80 %
Platelets: 275 10*3/uL (ref 150–400)
RBC: 4.59 MIL/uL (ref 3.87–5.11)
RDW: 13.1 % (ref 11.5–15.5)
WBC: 8.2 10*3/uL (ref 4.0–10.5)
nRBC: 0 % (ref 0.0–0.2)

## 2021-03-15 LAB — URINALYSIS, ROUTINE W REFLEX MICROSCOPIC
Bilirubin Urine: NEGATIVE
Glucose, UA: NEGATIVE mg/dL
Ketones, ur: 80 mg/dL — AB
Nitrite: NEGATIVE
Protein, ur: NEGATIVE mg/dL
Specific Gravity, Urine: >1.046 — ABNORMAL HIGH (ref 1.005–1.030)
pH: 6 (ref 5.0–8.0)

## 2021-03-15 LAB — COMPREHENSIVE METABOLIC PANEL
ALT: 19 U/L (ref 0–44)
AST: 25 U/L (ref 15–41)
Albumin: 4.6 g/dL (ref 3.5–5.0)
Alkaline Phosphatase: 58 U/L (ref 38–126)
Anion gap: 14 (ref 5–15)
BUN: <5 mg/dL — ABNORMAL LOW (ref 6–20)
CO2: 20 mmol/L — ABNORMAL LOW (ref 22–32)
Calcium: 9.9 mg/dL (ref 8.9–10.3)
Chloride: 99 mmol/L (ref 98–111)
Creatinine, Ser: 0.91 mg/dL (ref 0.44–1.00)
GFR, Estimated: >60 mL/min (ref >60)
Glucose, Bld: 93 mg/dL (ref 70–99)
Potassium: 3.6 mmol/L (ref 3.5–5.1)
Sodium: 133 mmol/L — ABNORMAL LOW (ref 135–145)
Total Bilirubin: 1.2 mg/dL (ref 0.3–1.2)
Total Protein: 8.6 g/dL — ABNORMAL HIGH (ref 6.5–8.1)

## 2021-03-15 LAB — RESP PANEL BY RT-PCR (FLU A&B, COVID) ARPGX2
Influenza A by PCR: NEGATIVE
Influenza B by PCR: NEGATIVE
SARS Coronavirus 2 by RT PCR: NEGATIVE

## 2021-03-15 LAB — LIPASE, BLOOD: Lipase: 23 U/L (ref 11–51)

## 2021-03-15 LAB — TROPONIN I (HIGH SENSITIVITY)
Troponin I (High Sensitivity): 3 ng/L (ref <18)
Troponin I (High Sensitivity): 3 ng/L (ref <18)

## 2021-03-15 LAB — I-STAT BETA HCG BLOOD, ED (MC, WL, AP ONLY): I-stat hCG, quantitative: <5 m[IU]/mL (ref <5)

## 2021-03-15 MED ORDER — MORPHINE SULFATE (PF) 4 MG/ML IV SOLN
4.0000 mg | Freq: Once | INTRAVENOUS | Status: DC
  Filled 2021-03-15: qty 1

## 2021-03-15 MED ORDER — IOHEXOL 350 MG/ML SOLN
100.0000 mL | Freq: Once | INTRAVENOUS | Status: AC | PRN
  Administered 2021-03-15: 100 mL via INTRAVENOUS

## 2021-03-15 MED ORDER — SODIUM CHLORIDE 0.9 % IV BOLUS
1000.0000 mL | Freq: Once | INTRAVENOUS | Status: AC
  Administered 2021-03-15: 19:00:00 1000 mL via INTRAVENOUS

## 2021-03-15 MED ORDER — SODIUM CHLORIDE 0.9 % IV SOLN
1.0000 g | Freq: Once | INTRAVENOUS | Status: DC
  Administered 2021-03-15: 1 g via INTRAVENOUS
  Filled 2021-03-15: qty 10

## 2021-03-15 MED ORDER — FENTANYL CITRATE (PF) 100 MCG/2ML IJ SOLN
50.0000 ug | INTRAMUSCULAR | Status: AC | PRN
  Administered 2021-03-15 – 2021-03-16 (×2): 50 ug via INTRAVENOUS
  Filled 2021-03-15 (×3): qty 2

## 2021-03-15 MED ORDER — ACETAMINOPHEN 500 MG PO TABS
1000.0000 mg | ORAL_TABLET | Freq: Once | ORAL | Status: AC
  Administered 2021-03-15: 20:00:00 1000 mg via ORAL
  Filled 2021-03-15: qty 2

## 2021-03-15 MED ORDER — FAMOTIDINE IN NACL 20-0.9 MG/50ML-% IV SOLN
20.0000 mg | Freq: Once | INTRAVENOUS | Status: AC
  Administered 2021-03-15: 19:00:00 20 mg via INTRAVENOUS
  Filled 2021-03-15: qty 50

## 2021-03-15 MED ORDER — SODIUM CHLORIDE 0.9 % IV BOLUS
1000.0000 mL | Freq: Once | INTRAVENOUS | Status: AC
  Administered 2021-03-15: 1000 mL via INTRAVENOUS

## 2021-03-15 NOTE — ED Notes (Signed)
Pt eating at this time.

## 2021-03-15 NOTE — ED Triage Notes (Signed)
Pt here with reports of shob and RUQ abdominal pain with chills. Pt tender to palpation. Endorses hx asthma and used inhaler pta. Pt lungs CTA.

## 2021-03-15 NOTE — ED Provider Notes (Signed)
Emergency Medicine Provider Triage Evaluation Note  Cassidy Lawrence , a 20 y.o. female  was evaluated in triage.  Pt complains of right-sided abdominal pain, shortness of breath, chest tightness, and nausea that started yesterday. Patient also endorses bilateral numbness/tingling to face and hands. History of asthma. No previous intubations. She notes she has required hospitalizations for her asthma. She used her albuterol prior to arrival. Denies wheeze. Denies fever, but admits to chills. No history of blood clots. No hormonal treatments.  Review of Systems  Positive: Abdominal pain, nausea, shortness of breath, chest tightness Negative: fever  Physical Exam  BP (!) 144/76 (BP Location: Right Arm)   Pulse (!) 104   Temp 99.5 F (37.5 C) (Oral)   Resp (!) 23   SpO2 100%  Gen:   Awake, no distress   Resp:  Normal effort, clear to auscultation bilaterally without wheeze  MSK:   Moves extremities without difficulty  Other:  Abd soft, non-distended, with diffuse tenderness  Medical Decision Making  Medically screening exam initiated at 12:34 PM.  Appropriate orders placed.  Lyncoln Maskell was informed that the remainder of the evaluation will be completed by another provider, this initial triage assessment does not replace that evaluation, and the importance of remaining in the ED until their evaluation is complete.  Labs ordered. CXR to rule out pulmonary etiology. No wheeze heard on exam; however patient used albuterol prior to evaluation. Troponin and EKG.    Mannie Stabile, PA-C 03/15/21 1240    Arby Barrette, MD 03/20/21 [phone]

## 2021-03-15 NOTE — ED Provider Notes (Signed)
[facility] EMERGENCY DEPARTMENT Provider Note   CSN: [id number] Arrival date & time: 03/15/21  1222     History Chief Complaint  Patient presents with   Shortness of Breath   Abdominal Pain    Cassidy Lawrence is a 20 y.o. female.  HPI Patient is a 20 year old female with past medical history significant for asthma.  She is presented today with 2 days of "stomach pain".  She is also complaining of chest pain has been ongoing for 2 days.  She states that she was in [place] on a trip after she took a train ride down that lasted for a couple hours she states that while in [place] on Tuesday she began having some abdominal pain.  She states that it was achy crampy and moderate.  Seem to get somewhat worse over the course of the day she states that it started hurting more in her right upper abdomen and then she began experiencing chest pain around 11 PM last night that was achy, described as a pressure and constant.  She states that she also started having shortness of breath and began experiencing some lightheadedness.  She states that she twice felt that she nearly passed out.  She has had some episodes of coughing but denies any hemoptysis.  No history of VTE.  No recent hospitalization or surgeries.  She denies any unilateral leg swelling or pain.  She states the pain is neither exertional nor pleuritic.       Past Medical History:  Diagnosis Date   Asthma     There are no problems to display for this patient.   History reviewed. No pertinent surgical history.   OB History   No obstetric history on file.     No family history on file.  Social History   Tobacco Use   Smoking status: Never   Smokeless tobacco: Never  Substance Use Topics   Alcohol use: Yes   Drug use: Yes    Types: Marijuana    Home Medications Prior to Admission medications   Medication Sig Start Date End Date Taking? Authorizing Provider  albuterol (VENTOLIN HFA) 108 (90  Base) MCG/ACT inhaler Inhale 2-3 puffs into the lungs every 6 (six) hours as needed for wheezing or shortness of breath.    [provider]  fluticasone (FLONASE) 50 MCG/ACT nasal spray Place 2 sprays into both nostrils daily. 04/01/20 06/17/20  Rodriguez-Southworth, Nettie Elm, PA-C  ibuprofen (ADVIL) 200 MG tablet Take 600 mg by mouth every 6 (six) hours as needed.    [provider]  ibuprofen (ADVIL) 800 MG tablet Take 1 tablet (800 mg total) by mouth 3 (three) times daily. 01/17/20   Elvina Sidle, MD  metroNIDAZOLE (FLAGYL) 500 MG tablet Take 1 tablet (500 mg total) by mouth 2 (two) times daily. 06/17/20   Merrilee Jansky, MD  predniSONE (DELTASONE) 20 MG tablet Take 1 tablet (20 mg total) by mouth daily with breakfast. 04/01/20   Rodriguez-Southworth, Nettie Elm, PA-C    Allergies    Patient has no known allergies.  Review of Systems   Review of Systems  Constitutional:  Positive for chills, fatigue and fever.  HENT:  Negative for congestion.   Eyes:  Negative for pain.  Respiratory:  Positive for shortness of breath. Negative for cough.   Cardiovascular:  Positive for chest pain. Negative for leg swelling.  Gastrointestinal:  Negative for abdominal pain, diarrhea, nausea and vomiting.  Genitourinary:  Negative for dysuria, frequency, hematuria, pelvic pain, vaginal  bleeding, vaginal discharge and vaginal pain.  Musculoskeletal:  Negative for myalgias.  Skin:  Negative for rash.  Neurological:  Negative for dizziness and headaches.  Psychiatric/Behavioral:  Negative for confusion.    Physical Exam Updated Vital Signs BP 109/85   Pulse 83   Temp (!) 102.2 F (39 C) (Oral)   Resp 18   LMP 02/27/2021 (Exact Date)   SpO2 100%   Physical Exam Vitals and nursing note reviewed.  Constitutional:      General: She is in acute distress.     Appearance: She is not toxic-appearing.     Comments: Pt appears to be in acute distress. Is quite uncomfortable, tachypnea present w  rate of 28. Speaking in full sentences   HENT:     Head: Normocephalic and atraumatic.     Nose: Nose normal.  Eyes:     General: No scleral icterus. Cardiovascular:     Rate and Rhythm: Regular rhythm. Tachycardia present.     Pulses: Normal pulses.     Heart sounds: Normal heart sounds.  Pulmonary:     Effort: Pulmonary effort is normal. Tachypnea present. No respiratory distress.     Breath sounds: Normal breath sounds. No wheezing.     Comments: Tachypnea, lungs CAB Abdominal:     Palpations: Abdomen is soft.     Tenderness: There is abdominal tenderness in the right upper quadrant and right lower quadrant. There is no right CVA tenderness or left CVA tenderness. Positive signs include McBurney's sign. Negative signs include Murphy's sign.  Musculoskeletal:     Cervical back: Normal range of motion.     Right lower leg: No edema.     Left lower leg: No edema.  Skin:    General: Skin is warm and dry.     Capillary Refill: Capillary refill takes less than 2 seconds.  Neurological:     Mental Status: She is alert. Mental status is at baseline.  Psychiatric:        Mood and Affect: Mood normal.        Behavior: Behavior normal.    ED Results / Procedures / Treatments   Labs (all labs ordered are listed, but only abnormal results are displayed) Labs Reviewed  COMPREHENSIVE METABOLIC PANEL - Abnormal; Notable for the following components:      Result Value   Sodium 133 (*)    CO2 20 (*)    BUN <5 (*)    Total Protein 8.6 (*)    All other components within normal limits  URINALYSIS, ROUTINE W REFLEX MICROSCOPIC - Abnormal; Notable for the following components:   APPearance HAZY (*)    Specific Gravity, Urine >1.046 (*)    Hgb urine dipstick MODERATE (*)    Ketones, ur 80 (*)    Leukocytes,Ua SMALL (*)    Bacteria, UA RARE (*)    All other components within normal limits  RESP PANEL BY RT-PCR (FLU A&B, COVID) ARPGX2  CULTURE, BLOOD (ROUTINE X 2)  CULTURE, BLOOD  (ROUTINE X 2)  URINE CULTURE  CBC WITH DIFFERENTIAL/PLATELET  LIPASE, BLOOD  I-STAT BETA HCG BLOOD, ED (MC, WL, AP ONLY)  TROPONIN I (HIGH SENSITIVITY)  TROPONIN I (HIGH SENSITIVITY)    EKG None  Radiology DG Chest 2 View  Result Date: 03/15/2021 CLINICAL DATA:  Chest pain and shortness of breath EXAM: CHEST - 2 VIEW COMPARISON:  None. FINDINGS: Lungs are clear. Heart size and pulmonary vascularity are normal. No adenopathy. No pneumothorax. No bone lesions. IMPRESSION:  Lungs clear.  Cardiac silhouette normal. Electronically Signed   By: Bretta Bang III M.D.   On: 03/15/2021 13:27   CT Angio Chest PE W/Cm &/Or Wo Cm  Result Date: 03/15/2021 CLINICAL DATA:  Shortness of breath, right abdominal pain, chest tightness and nausea for 1 day, history of asthma EXAM: CT ANGIOGRAPHY CHEST CT ABDOMEN AND PELVIS WITH CONTRAST TECHNIQUE: Multidetector CT imaging of the chest was performed using the standard protocol during bolus administration of intravenous contrast. Multiplanar CT image reconstructions and MIPs were obtained to evaluate the vascular anatomy. Multidetector CT imaging of the abdomen and pelvis was performed using the standard protocol during bolus administration of intravenous contrast. CONTRAST:  OMNIPAQUE IOHEXOL 350 MG/ML SOLN COMPARISON:  None. FINDINGS: CTA CHEST FINDINGS Cardiovascular: Satisfactory opacification the pulmonary arteries to the segmental level. No pulmonary artery filling defects are identified. Central pulmonary arteries are normal caliber. Normal heart size. No pericardial effusion. The aortic root is suboptimally assessed given cardiac pulsation artifact. The aorta is normal caliber. No acute luminal abnormality of the imaged aorta. No periaortic stranding or hemorrhage. Shared origin of the brachiocephalic and left common carotid arteries. Proximal great vessels are otherwise unremarkable. Mediastinum/Nodes: Wedge-shaped soft tissue attenuation in the  anterior mediastinum, nonspecific though favored to reflect a thymic remnant in a patient of this age. No mediastinal fluid or gas. Normal thyroid gland and thoracic inlet. No acute abnormality of the trachea or esophagus. No worrisome mediastinal, hilar or axillary adenopathy. Lungs/Pleura: Mild airways thickening. No consolidation. No pneumothorax or visible effusion. No concerning pulmonary nodules or masses. Musculoskeletal: None Review of the MIP images confirms the above findings. CT ABDOMEN and PELVIS FINDINGS Hepatobiliary: No worrisome focal liver abnormality is seen. Normal gallbladder. No visible calcified gallstones. No biliary ductal dilatation. Pancreas: No pancreatic ductal dilatation or surrounding inflammatory changes. Spleen: Normal in size. No concerning splenic lesions. Adrenals/Urinary Tract: Normal adrenals. Abdomen and pelvis is imaged in the late nephrographic/excretory phase. Suspect some faint striations involving the upper pole right renal nephrogram with a wedge-shaped region of hypoattenuation and possible collection in the upper pole (6/52, 3/21) measuring up to 1.6 x 1.5 x 1.7 cm in size. No hydronephrosis. No urolithiasis. Urinary bladder is unremarkable for the degree of distention. Stomach/Bowel: Distal esophagus, stomach and duodenal sweep are unremarkable. No small bowel wall thickening or dilatation. No evidence of obstruction. A normal air-filled appendix is present in the right lower quadrant coursing in a retrocecal position from the cecal tip. No colonic dilatation or wall thickening. Vascular/Lymphatic: No significant vascular findings are present. No enlarged abdominal or pelvic lymph nodes though challenging evaluation of the mesentery given a paucity of intraperitoneal fat. Reproductive: Anteverted uterus deviated slightly leftward. No concerning adnexal lesions. Peripherally enhancing probable corpus luteum in the right ovary. Other: No abdominopelvic free air or fluid.  No bowel containing hernias. Musculoskeletal: No acute osseous abnormality or suspicious osseous lesion. Review of the MIP images confirms the above findings. IMPRESSION: No evidence of pulmonary artery embolism. Mild diffuse airways thickening, can reflect acute or chronic reactive airways disease versus bronchitis. Wedge-shaped region of hypoattenuation in the upper pole right kidney with some questionable striations of the nephrogram. Recommend correlation with urinalysis to assess for ascending tract infection and possible renal abscess given a slightly volume positive appearance of this focus as well as the presenting symptoms. Alternative etiologies could include incidental renal cyst or mass versus infarct. Could consider a dedicated renal ultrasound for further characterization. Wedge-shaped soft tissue attenuation anterior mediastinum,  favor thymic remnant with Electronically Signed   By: Kreg Shropshire M.D.   On: 03/15/2021 22:15   CT ABDOMEN PELVIS W CONTRAST  Result Date: 03/15/2021 CLINICAL DATA:  Shortness of breath, right abdominal pain, chest tightness and nausea for 1 day, history of asthma EXAM: CT ANGIOGRAPHY CHEST CT ABDOMEN AND PELVIS WITH CONTRAST TECHNIQUE: Multidetector CT imaging of the chest was performed using the standard protocol during bolus administration of intravenous contrast. Multiplanar CT image reconstructions and MIPs were obtained to evaluate the vascular anatomy. Multidetector CT imaging of the abdomen and pelvis was performed using the standard protocol during bolus administration of intravenous contrast. CONTRAST:  OMNIPAQUE IOHEXOL 350 MG/ML SOLN COMPARISON:  None. FINDINGS: CTA CHEST FINDINGS Cardiovascular: Satisfactory opacification the pulmonary arteries to the segmental level. No pulmonary artery filling defects are identified. Central pulmonary arteries are normal caliber. Normal heart size. No pericardial effusion. The aortic root is suboptimally assessed  given cardiac pulsation artifact. The aorta is normal caliber. No acute luminal abnormality of the imaged aorta. No periaortic stranding or hemorrhage. Shared origin of the brachiocephalic and left common carotid arteries. Proximal great vessels are otherwise unremarkable. Mediastinum/Nodes: Wedge-shaped soft tissue attenuation in the anterior mediastinum, nonspecific though favored to reflect a thymic remnant in a patient of this age. No mediastinal fluid or gas. Normal thyroid gland and thoracic inlet. No acute abnormality of the trachea or esophagus. No worrisome mediastinal, hilar or axillary adenopathy. Lungs/Pleura: Mild airways thickening. No consolidation. No pneumothorax or visible effusion. No concerning pulmonary nodules or masses. Musculoskeletal: None Review of the MIP images confirms the above findings. CT ABDOMEN and PELVIS FINDINGS Hepatobiliary: No worrisome focal liver abnormality is seen. Normal gallbladder. No visible calcified gallstones. No biliary ductal dilatation. Pancreas: No pancreatic ductal dilatation or surrounding inflammatory changes. Spleen: Normal in size. No concerning splenic lesions. Adrenals/Urinary Tract: Normal adrenals. Abdomen and pelvis is imaged in the late nephrographic/excretory phase. Suspect some faint striations involving the upper pole right renal nephrogram with a wedge-shaped region of hypoattenuation and possible collection in the upper pole (6/52, 3/21) measuring up to 1.6 x 1.5 x 1.7 cm in size. No hydronephrosis. No urolithiasis. Urinary bladder is unremarkable for the degree of distention. Stomach/Bowel: Distal esophagus, stomach and duodenal sweep are unremarkable. No small bowel wall thickening or dilatation. No evidence of obstruction. A normal air-filled appendix is present in the right lower quadrant coursing in a retrocecal position from the cecal tip. No colonic dilatation or wall thickening. Vascular/Lymphatic: No significant vascular findings are  present. No enlarged abdominal or pelvic lymph nodes though challenging evaluation of the mesentery given a paucity of intraperitoneal fat. Reproductive: Anteverted uterus deviated slightly leftward. No concerning adnexal lesions. Peripherally enhancing probable corpus luteum in the right ovary. Other: No abdominopelvic free air or fluid. No bowel containing hernias. Musculoskeletal: No acute osseous abnormality or suspicious osseous lesion. Review of the MIP images confirms the above findings. IMPRESSION: No evidence of pulmonary artery embolism. Mild diffuse airways thickening, can reflect acute or chronic reactive airways disease versus bronchitis. Wedge-shaped region of hypoattenuation in the upper pole right kidney with some questionable striations of the nephrogram. Recommend correlation with urinalysis to assess for ascending tract infection and possible renal abscess given a slightly volume positive appearance of this focus as well as the presenting symptoms. Alternative etiologies could include incidental renal cyst or mass versus infarct. Could consider a dedicated renal ultrasound for further characterization. Wedge-shaped soft tissue attenuation anterior mediastinum, favor thymic remnant with Electronically Signed  By: Kreg Shropshire M.D.   On: 03/15/2021 22:15   US Abdomen Limited RUQ (LIVER/GB)  Result Date: 03/15/2021 CLINICAL DATA:  Right upper quadrant pain EXAM: ULTRASOUND ABDOMEN LIMITED RIGHT UPPER QUADRANT COMPARISON:  None. FINDINGS: Gallbladder: No gallstones or wall thickening visualized. No sonographic Murphy sign noted by sonographer. Common bile duct: Diameter: 2 mm, normal Liver: No focal lesion identified. Within normal limits in parenchymal echogenicity. Portal vein is patent on color Doppler imaging with normal direction of blood flow towards the liver. Other: None. IMPRESSION: Normal examination. Electronically Signed   By: Burman Nieves M.D.   On: 03/15/2021 19:10     Procedures Procedures   Medications Ordered in ED Medications  fentaNYL (SUBLIMAZE) injection 50 mcg (50 mcg Intravenous Given 03/15/21 1924)  cefTRIAXone (ROCEPHIN) 1 g in sodium chloride 0.9 % 100 mL IVPB (has no administration in time range)  acetaminophen (TYLENOL) tablet 1,000 mg (1,000 mg Oral Given 03/15/21 1930)  sodium chloride 0.9 % bolus 1,000 mL (0 mLs Intravenous Stopped 03/15/21 2204)  famotidine (PEPCID) IVPB 20 mg premix (0 mg Intravenous Stopped 03/15/21 2204)  iohexol (OMNIPAQUE) 350 MG/ML injection 100 mL (100 mLs Intravenous Contrast Given 03/15/21 2158)  sodium chloride 0.9 % bolus 1,000 mL (1,000 mLs Intravenous New Bag/Given 03/15/21 2236)    ED Course  I have reviewed the triage vital signs and the nursing notes.  Pertinent labs & imaging results that were available during my care of the patient were reviewed by me and considered in my medical decision making (see chart for details).  Pt is tachynic, tachycardic, with low grade fever of  99.5  She is quite uncomfortable appearing and appears to be in acute distress.  She is speaking full sentences but breathing hard.  She is nearly tearful when describing her pain.  She states that she is having severe chest pain and feels short of breath.  She is also complaining of right upper quadrant right lower quadrant abdominal pain.  She explained to her right side and indicating with the pain is.  I do have some concern for pulmonary embolism.  Also for COVID-19 given she has a low-grade mildly elevated temperature.  We will recheck her temperature at regular intervals  On recheck patient's temperature is 102.  Will give Tylenol, fluids  Will CT chest abdomen pelvis with contrast given the concern for PE as well as tenderness palpation of right lower quadrant concerning for appendicitis patient's fever is concerning for UTI/Pilo, cholecystitis, appendicitis, other intra-abdominal infection.  Urinalysis is somewhat  underwhelming in terms of evidence of infection with rare bacteria small leukocytes however she does have moderate hemoglobin does have some evidence of dehydration fairly specific gravity.  Squamous epithelial cells are present which may indicate contamination.  Urine culture sent.  Troponin within normal limits.  I-STAT hCG negative for pregnancy.  COVID and influenza negative.  Lipase within normal limits at pancreatitis.  CBC without leukocytosis.  CMP notable for CO2 of 20  Clinical Course as of 03/16/21 0036  Wed Mar 15, 2021  1829 Felt fine Monday. Tuesday began having "stomach" pain and LH and near syncopal.  CP started after. Approximately 11pm. Pressure. Non exertional or pleuritic.  [WF]  2316 Temp 98.8 HR 88 [WF]  2329 Discussed with Patience Musca MD of urology who recommends admission for IV antibiotics.  If patient does not improve may benefit from IR consultation for possible drainage if does improve may benefit from repeat CT imaging. [WF]  Thu Mar 16, 2021  0001 Discussed with hospitalist who will admit.  [WF]    Clinical Course User Index [WF] Gailen ShelterFondaw, Lynel Forester S, PA   MDM Rules/Calculators/A&P                          Chest x-ray unremarkable and a CT PE study shows no PE.  Patient has a history of asthma but is not wheezing.  Some evidence of bronchitis may have respiratory viral illness on top of her pyelonephritis.  Upper quadrant ultrasound negative for cholecystitis no evidence of choledocholithiasis.  Patient has normal LFTs.  Discussed with urology MD who recommended admission see clinical course index for description of plan.  Admitted to medicine.  Final Clinical Impression(s) / ED Diagnoses Final diagnoses:  RUQ abdominal pain  Chest pain, unspecified type  Right lower quadrant abdominal tenderness with rebound tenderness  Pyelonephritis  Fever, unspecified fever cause  Pyelonephritis  Rx / DC Orders ED Discharge Orders     None        Gailen ShelterFondaw,  Naylin Burkle S, GeorgiaPA 03/16/21 0042    Cheryll CockayneHong, Joshua S, MD 03/17/21 [phone]

## 2021-03-16 DIAGNOSIS — N12 Tubulo-interstitial nephritis, not specified as acute or chronic: Secondary | ICD-10-CM | POA: Diagnosis present

## 2021-03-16 DIAGNOSIS — E876 Hypokalemia: Secondary | ICD-10-CM | POA: Diagnosis present

## 2021-03-16 DIAGNOSIS — Z79899 Other long term (current) drug therapy: Secondary | ICD-10-CM | POA: Diagnosis not present

## 2021-03-16 DIAGNOSIS — E872 Acidosis: Secondary | ICD-10-CM | POA: Diagnosis present

## 2021-03-16 DIAGNOSIS — F1210 Cannabis abuse, uncomplicated: Secondary | ICD-10-CM | POA: Diagnosis present

## 2021-03-16 DIAGNOSIS — F1010 Alcohol abuse, uncomplicated: Secondary | ICD-10-CM | POA: Diagnosis present

## 2021-03-16 DIAGNOSIS — Z5329 Procedure and treatment not carried out because of patient's decision for other reasons: Secondary | ICD-10-CM | POA: Diagnosis present

## 2021-03-16 DIAGNOSIS — J45909 Unspecified asthma, uncomplicated: Secondary | ICD-10-CM | POA: Diagnosis present

## 2021-03-16 DIAGNOSIS — A419 Sepsis, unspecified organism: Secondary | ICD-10-CM | POA: Diagnosis present

## 2021-03-16 DIAGNOSIS — Z20822 Contact with and (suspected) exposure to covid-19: Secondary | ICD-10-CM | POA: Diagnosis present

## 2021-03-16 LAB — BASIC METABOLIC PANEL
Anion gap: 8 (ref 5–15)
BUN: <5 mg/dL — ABNORMAL LOW (ref 6–20)
CO2: 20 mmol/L — ABNORMAL LOW (ref 22–32)
Calcium: 8 mg/dL — ABNORMAL LOW (ref 8.9–10.3)
Chloride: 107 mmol/L (ref 98–111)
Creatinine, Ser: 0.71 mg/dL (ref 0.44–1.00)
GFR, Estimated: >60 mL/min (ref >60)
Glucose, Bld: 101 mg/dL — ABNORMAL HIGH (ref 70–99)
Potassium: 3.3 mmol/L — ABNORMAL LOW (ref 3.5–5.1)
Sodium: 135 mmol/L (ref 135–145)

## 2021-03-16 LAB — CBC
HCT: 33.3 % — ABNORMAL LOW (ref 36.0–46.0)
HCT: 32.5 % — ABNORMAL LOW (ref 36.0–46.0)
Hemoglobin: 11.5 g/dL — ABNORMAL LOW (ref 12.0–15.0)
Hemoglobin: 11.3 g/dL — ABNORMAL LOW (ref 12.0–15.0)
MCH: 29.4 pg (ref 26.0–34.0)
MCH: 29.4 pg (ref 26.0–34.0)
MCHC: 34.5 g/dL (ref 30.0–36.0)
MCHC: 34.8 g/dL (ref 30.0–36.0)
MCV: 85.2 fL (ref 80.0–100.0)
MCV: 84.4 fL (ref 80.0–100.0)
Platelets: 227 10*3/uL (ref 150–400)
Platelets: 217 10*3/uL (ref 150–400)
RBC: 3.91 MIL/uL (ref 3.87–5.11)
RBC: 3.85 MIL/uL — ABNORMAL LOW (ref 3.87–5.11)
RDW: 13.1 % (ref 11.5–15.5)
RDW: 13.2 % (ref 11.5–15.5)
WBC: 8.6 10*3/uL (ref 4.0–10.5)
WBC: 7.7 10*3/uL (ref 4.0–10.5)
nRBC: 0 % (ref 0.0–0.2)
nRBC: 0 % (ref 0.0–0.2)

## 2021-03-16 LAB — MRSA PCR SCREENING: MRSA by PCR: NEGATIVE

## 2021-03-16 LAB — RAPID URINE DRUG SCREEN, HOSP PERFORMED
Amphetamines: NOT DETECTED
Barbiturates: NOT DETECTED
Benzodiazepines: NOT DETECTED
Cocaine: NOT DETECTED
Opiates: NOT DETECTED
Tetrahydrocannabinol: POSITIVE — AB

## 2021-03-16 LAB — PREGNANCY, URINE: Preg Test, Ur: NEGATIVE

## 2021-03-16 LAB — PHOSPHORUS: Phosphorus: 2.6 mg/dL (ref 2.5–4.6)

## 2021-03-16 LAB — CREATININE, SERUM
Creatinine, Ser: 0.81 mg/dL (ref 0.44–1.00)
GFR, Estimated: >60 mL/min (ref >60)

## 2021-03-16 LAB — MAGNESIUM: Magnesium: 1.9 mg/dL (ref 1.7–2.4)

## 2021-03-16 MED ORDER — MELATONIN 3 MG PO TABS: 3.0000 mg | ORAL_TABLET | Freq: Every evening | ORAL | Status: DC | PRN

## 2021-03-16 MED ORDER — OXYCODONE HCL 5 MG PO TABS
5.0000 mg | ORAL_TABLET | Freq: Four times a day (QID) | ORAL | Status: DC | PRN
  Administered 2021-03-16 – 2021-03-17 (×2): 5 mg via ORAL
  Filled 2021-03-16 (×3): qty 1

## 2021-03-16 MED ORDER — SODIUM CHLORIDE 0.9 % IV SOLN
1.0000 g | INTRAVENOUS | Status: DC
  Administered 2021-03-16: 1 g via INTRAVENOUS
  Filled 2021-03-16: qty 1
  Filled 2021-03-16: qty 10

## 2021-03-16 MED ORDER — POLYETHYLENE GLYCOL 3350 17 G PO PACK: 17.0000 g | PACK | Freq: Every day | ORAL | Status: DC | PRN

## 2021-03-16 MED ORDER — LACTATED RINGERS IV SOLN: INTRAVENOUS | Status: DC

## 2021-03-16 MED ORDER — ENOXAPARIN SODIUM 40 MG/0.4ML IJ SOSY
40.0000 mg | PREFILLED_SYRINGE | Freq: Every day | INTRAMUSCULAR | Status: DC
  Administered 2021-03-17: 40 mg via SUBCUTANEOUS
  Filled 2021-03-16 (×2): qty 0.4

## 2021-03-16 MED ORDER — POTASSIUM CHLORIDE CRYS ER 20 MEQ PO TBCR
40.0000 meq | EXTENDED_RELEASE_TABLET | Freq: Once | ORAL | Status: AC
  Administered 2021-03-16: 40 meq via ORAL
  Filled 2021-03-16: qty 2

## 2021-03-16 MED ORDER — ACETAMINOPHEN 325 MG PO TABS
650.0000 mg | ORAL_TABLET | Freq: Four times a day (QID) | ORAL | Status: DC | PRN
  Administered 2021-03-17: 650 mg via ORAL
  Filled 2021-03-16 (×2): qty 2

## 2021-03-16 MED ORDER — PIPERACILLIN-TAZOBACTAM 3.375 G IVPB
3.3750 g | Freq: Three times a day (TID) | INTRAVENOUS | Status: DC
  Administered 2021-03-16 (×2): 3.375 g via INTRAVENOUS
  Filled 2021-03-16 (×3): qty 50

## 2021-03-16 MED ORDER — IPRATROPIUM-ALBUTEROL 0.5-2.5 (3) MG/3ML IN SOLN: 3.0000 mL | RESPIRATORY_TRACT | Status: DC | PRN

## 2021-03-16 MED ORDER — SODIUM CHLORIDE 0.9 % IV SOLN: 2.0000 g | INTRAVENOUS | Status: DC

## 2021-03-16 MED ORDER — MAGNESIUM SULFATE 2 GM/50ML IV SOLN
2.0000 g | Freq: Once | INTRAVENOUS | Status: AC
  Administered 2021-03-16: 2 g via INTRAVENOUS
  Filled 2021-03-16: qty 50

## 2021-03-16 NOTE — TOC Initial Note (Signed)
Transition of Care [facility]) - Initial/Assessment Note    Patient Details  Name: Cassidy Lawrence MRN: [id number] Date of Birth: 08-31-2001  [facility]) CM/SW Contact:    Lockie Pares, RN Phone Number: 03/16/2021, 3:36 PM  Clinical Narrative:                  Patient admitted 19 YO Pyelonephritis on IV antibiotics.  Lives in apartment has mother to call on, has insurance  CM will follow for needs.  Expected Discharge Plan: Home/Self Care Barriers to Discharge: Continued Medical Work up   Patient Goals and CMS Choice        Expected Discharge Plan and Services Expected Discharge Plan: Home/Self Care       Living arrangements for the past 2 months: Apartment                                      Prior Living Arrangements/Services Living arrangements for the past 2 months: Apartment Lives with:: Self Patient language and need for interpreter reviewed:: Yes        Need for Family Participation in Patient Care: Yes (Comment) Care giver support system in place?: Yes (comment)   Criminal Activity/Legal Involvement Pertinent to Current Situation/Hospitalization: No - Comment as needed  Activities of Daily Living      Permission Sought/Granted                  Emotional Assessment       Orientation: : Oriented to Self, Oriented to Place, Oriented to  Time, Oriented to Situation Alcohol / Substance Use: Not Applicable Psych Involvement: No (comment)  Admission diagnosis:  Pyelonephritis [N12] RUQ abdominal pain [R10.11] Fever, unspecified fever cause [R50.9] Right lower quadrant abdominal tenderness with rebound tenderness [R10.823] Chest pain, unspecified type [R07.9] Patient Active Problem List   Diagnosis Date Noted   Pyelonephritis 03/16/2021   PCP:  Patient, No Pcp Per (Inactive) Pharmacy:   [facility] - [place], [place] - [address] [place] [address] Phone:  [phone] Fax: [phone]  [facility] [phone] - [place], [place] - [address] AT [facility] [address] [place] [address] Phone: [phone] Fax: [phone]  [facility] #[id number] [place], [place] - [address] AT [facility] OF [facility] [address] & [facility] [address] [place] [address] Phone: [phone] Fax: [phone]  [facility] #[id number] [place], [place] - [address] AT [facility] OF [address] & [address] [facility] [address] Phone: [phone] Fax: [phone]     Social Determinants of Health (SDOH) Interventions    Readmission Risk Interventions No flowsheet data found.

## 2021-03-16 NOTE — Progress Notes (Signed)
Pharmacy Antibiotic Note  Cassidy Lawrence is a 20 y.o. female admitted on 03/15/2021 with abdominal pain/?renal abscess.  Pharmacy has been consulted for Zosyn dosing. WBC WNL. Renal function good.   Plan: Zosyn 3.375G IV q8h to be infused over 4 hours Trend WBC, temp, renal function  F/U infectious work-up      Temp (24hrs), Avg:100.3 F (37.9 C), Min:98.6 F (37 C), Max:102.2 F (39 C)  Recent Labs  Lab 03/15/21 1234  WBC 8.2  CREATININE 0.91    CrCl cannot be calculated (Unknown ideal weight.).    No Known Allergies  Abran Duke, PharmD, BCPS Clinical Pharmacist Phone: [phone]

## 2021-03-16 NOTE — Progress Notes (Signed)
Patient transferred to [facility] room 4.

## 2021-03-16 NOTE — H&P (Addendum)
History and Physical  Cassidy Lawrence:[id number] DOB: 05-25-2001 DOA: 03/15/2021  Referring physician: Virgina Lawrence PCP: Patient, No Pcp Per (Inactive)  Outpatient Specialists: None. Patient coming from: Home.   Chief Complaint: Multiple complaints including chest pain, shortness of breath, right upper and right lower quadrant abdominal pain.    HPI: Cassidy Lawrence is a 20 y.o. female with medical history significant for asthma, who presented to [facility] ED due to multiple complaints including chest pain, shortness of breath, right upper and right lower quadrants abdominal pain.  While in the ED she was tachypneic which prompted EDP to evaluate her for possible PE, CTA PE was negative.  She had a fever with T-max 102.2 which prompted CT abdomen and pelvis with contrast.  It revealed concern for right pyelonephritis and possible right renal abscess.  EDP discussed case with urology who recommended admission for IV antibiotics and IR consultation for possible drainage if no improvement with IV antibiotics.  Code sepsis was called in the ED, urine culture and blood cultures x2 peripherally were obtained.  Started on Rocephin empirically.  TRH, hospitalist team, was asked to admit.  ED Course:  Fever with T-max 102.2.  BP 118/68, pulse 80, respiratory rate 28.  Lab studies significant for serum sodium 133, potassium 3.6, creatinine 0.91, GFR greater than 60, serum bicarb 20, anion gap 14.  WBC 8.2, hemoglobin 13.2, platelet 275.  Review of Systems: Review of systems as noted in the HPI. All other systems reviewed and are negative.   Past Medical History:  Diagnosis Date   Asthma    History reviewed. No pertinent surgical history.  Social History:  reports that she has never smoked. She has never used smokeless tobacco. She reports current alcohol use. She reports current drug use. Drug: Marijuana.   No Known Allergies  Family history: No pertinent family history.  Prior to Admission  medications   Medication Sig Start Date End Date Taking? Authorizing Provider  albuterol (VENTOLIN HFA) 108 (90 Base) MCG/ACT inhaler Inhale 2-3 puffs into the lungs every 6 (six) hours as needed for wheezing or shortness of breath.    [provider]  fluticasone (FLONASE) 50 MCG/ACT nasal spray Place 2 sprays into both nostrils daily. 04/01/20 06/17/20  Lawrence, Cassidy Elm, PA-C  ibuprofen (ADVIL) 200 MG tablet Take 600 mg by mouth every 6 (six) hours as needed.    [provider]  ibuprofen (ADVIL) 800 MG tablet Take 1 tablet (800 mg total) by mouth 3 (three) times daily. 01/17/20   Cassidy Sidle, MD  metroNIDAZOLE (FLAGYL) 500 MG tablet Take 1 tablet (500 mg total) by mouth 2 (two) times daily. 06/17/20   Lawrence, Cassidy Mccreedy, MD  predniSONE (DELTASONE) 20 MG tablet Take 1 tablet (20 mg total) by mouth daily with breakfast. 04/01/20   Lawrence, Cassidy Elm, PA-C  sulfamethoxazole-trimethoprim (BACTRIM DS) 800-160 MG tablet Take 1 tablet by mouth 2 (two) times daily. 02/16/21   [provider]    Physical Exam: BP 109/85   Pulse 83   Temp 98.6 F (37 C) (Oral)   Resp 18   LMP 02/27/2021 (Exact Date)   SpO2 100%   General: 20 y.o. year-old female well developed well nourished in no acute distress.  Alert and oriented x3. Cardiovascular: Regular rate and rhythm with no rubs or gallops.  No thyromegaly or JVD noted.  No lower extremity edema. 2/4 pulses in all 4 extremities. Respiratory: Clear to auscultation with no wheezes or rales. Good inspiratory effort. Abdomen: Soft nontender nondistended  with normal bowel sounds x4 quadrants. Muskuloskeletal: No cyanosis, clubbing or edema noted bilaterally Neuro: CN II-XII intact, strength, sensation, reflexes Skin: No ulcerative lesions noted or rashes Psychiatry: Judgement and insight appear normal. Mood is appropriate for condition and setting          Labs on Admission:  Basic Metabolic Panel: Recent  Labs  Lab 03/15/21 1234  NA 133*  K 3.6  CL 99  CO2 20*  GLUCOSE 93  BUN <5*  CREATININE 0.91  CALCIUM 9.9   Liver Function Tests: Recent Labs  Lab 03/15/21 1234  AST 25  ALT 19  ALKPHOS 58  BILITOT 1.2  PROT 8.6*  ALBUMIN 4.6   Recent Labs  Lab 03/15/21 1234  LIPASE 23   No results for input(s): AMMONIA in the last 168 hours. CBC: Recent Labs  Lab 03/15/21 1234  WBC 8.2  NEUTROABS 6.6  HGB 13.2  HCT 39.3  MCV 85.6  PLT 275   Cardiac Enzymes: No results for input(s): CKTOTAL, CKMB, CKMBINDEX, TROPONINI in the last 168 hours.  BNP (last 3 results) No results for input(s): BNP in the last 8760 hours.  ProBNP (last 3 results) No results for input(s): PROBNP in the last 8760 hours.  CBG: No results for input(s): GLUCAP in the last 168 hours.  Radiological Exams on Admission: DG Chest 2 View  Result Date: 03/15/2021 CLINICAL DATA:  Chest pain and shortness of breath EXAM: CHEST - 2 VIEW COMPARISON:  None. FINDINGS: Lungs are clear. Heart size and pulmonary vascularity are normal. No adenopathy. No pneumothorax. No bone lesions. IMPRESSION: Lungs clear.  Cardiac silhouette normal. Electronically Signed   By: Bretta BangWilliam  Woodruff III M.D.   On: 03/15/2021 13:27   CT Angio Chest PE W/Cm &/Or Wo Cm  Result Date: 03/15/2021 CLINICAL DATA:  Shortness of breath, right abdominal pain, chest tightness and nausea for 1 day, history of asthma EXAM: CT ANGIOGRAPHY CHEST CT ABDOMEN AND PELVIS WITH CONTRAST TECHNIQUE: Multidetector CT imaging of the chest was performed using the standard protocol during bolus administration of intravenous contrast. Multiplanar CT image reconstructions and MIPs were obtained to evaluate the vascular anatomy. Multidetector CT imaging of the abdomen and pelvis was performed using the standard protocol during bolus administration of intravenous contrast. CONTRAST:  100mL OMNIPAQUE IOHEXOL 350 MG/ML SOLN COMPARISON:  None. FINDINGS: CTA CHEST FINDINGS  Cardiovascular: Satisfactory opacification the pulmonary arteries to the segmental level. No pulmonary artery filling defects are identified. Central pulmonary arteries are normal caliber. Normal heart size. No pericardial effusion. The aortic root is suboptimally assessed given cardiac pulsation artifact. The aorta is normal caliber. No acute luminal abnormality of the imaged aorta. No periaortic stranding or hemorrhage. Shared origin of the brachiocephalic and left common carotid arteries. Proximal great vessels are otherwise unremarkable. Mediastinum/Nodes: Wedge-shaped soft tissue attenuation in the anterior mediastinum, nonspecific though favored to reflect a thymic remnant in a patient of this age. No mediastinal fluid or gas. Normal thyroid gland and thoracic inlet. No acute abnormality of the trachea or esophagus. No worrisome mediastinal, hilar or axillary adenopathy. Lungs/Pleura: Mild airways thickening. No consolidation. No pneumothorax or visible effusion. No concerning pulmonary nodules or masses. Musculoskeletal: None Review of the MIP images confirms the above findings. CT ABDOMEN and PELVIS FINDINGS Hepatobiliary: No worrisome focal liver abnormality is seen. Normal gallbladder. No visible calcified gallstones. No biliary ductal dilatation. Pancreas: No pancreatic ductal dilatation or surrounding inflammatory changes. Spleen: Normal in size. No concerning splenic lesions. Adrenals/Urinary Tract: Normal adrenals. Abdomen  and pelvis is imaged in the late nephrographic/excretory phase. Suspect some faint striations involving the upper pole right renal nephrogram with a wedge-shaped region of hypoattenuation and possible collection in the upper pole (6/52, 3/21) measuring up to 1.6 x 1.5 x 1.7 cm in size. No hydronephrosis. No urolithiasis. Urinary bladder is unremarkable for the degree of distention. Stomach/Bowel: Distal esophagus, stomach and duodenal sweep are unremarkable. No small bowel wall  thickening or dilatation. No evidence of obstruction. A normal air-filled appendix is present in the right lower quadrant coursing in a retrocecal position from the cecal tip. No colonic dilatation or wall thickening. Vascular/Lymphatic: No significant vascular findings are present. No enlarged abdominal or pelvic lymph nodes though challenging evaluation of the mesentery given a paucity of intraperitoneal fat. Reproductive: Anteverted uterus deviated slightly leftward. No concerning adnexal lesions. Peripherally enhancing probable corpus luteum in the right ovary. Other: No abdominopelvic free air or fluid. No bowel containing hernias. Musculoskeletal: No acute osseous abnormality or suspicious osseous lesion. Review of the MIP images confirms the above findings. IMPRESSION: No evidence of pulmonary artery embolism. Mild diffuse airways thickening, can reflect acute or chronic reactive airways disease versus bronchitis. Wedge-shaped region of hypoattenuation in the upper pole right kidney with some questionable striations of the nephrogram. Recommend correlation with urinalysis to assess for ascending tract infection and possible renal abscess given a slightly volume positive appearance of this focus as well as the presenting symptoms. Alternative etiologies could include incidental renal cyst or mass versus infarct. Could consider a dedicated renal ultrasound for further characterization. Wedge-shaped soft tissue attenuation anterior mediastinum, favor thymic remnant with Electronically Signed   By: Kreg Shropshire M.D.   On: 03/15/2021 22:15   CT ABDOMEN PELVIS W CONTRAST  Result Date: 03/15/2021 CLINICAL DATA:  Shortness of breath, right abdominal pain, chest tightness and nausea for 1 day, history of asthma EXAM: CT ANGIOGRAPHY CHEST CT ABDOMEN AND PELVIS WITH CONTRAST TECHNIQUE: Multidetector CT imaging of the chest was performed using the standard protocol during bolus administration of intravenous contrast.  Multiplanar CT image reconstructions and MIPs were obtained to evaluate the vascular anatomy. Multidetector CT imaging of the abdomen and pelvis was performed using the standard protocol during bolus administration of intravenous contrast. CONTRAST:  OMNIPAQUE IOHEXOL 350 MG/ML SOLN COMPARISON:  None. FINDINGS: CTA CHEST FINDINGS Cardiovascular: Satisfactory opacification the pulmonary arteries to the segmental level. No pulmonary artery filling defects are identified. Central pulmonary arteries are normal caliber. Normal heart size. No pericardial effusion. The aortic root is suboptimally assessed given cardiac pulsation artifact. The aorta is normal caliber. No acute luminal abnormality of the imaged aorta. No periaortic stranding or hemorrhage. Shared origin of the brachiocephalic and left common carotid arteries. Proximal great vessels are otherwise unremarkable. Mediastinum/Nodes: Wedge-shaped soft tissue attenuation in the anterior mediastinum, nonspecific though favored to reflect a thymic remnant in a patient of this age. No mediastinal fluid or gas. Normal thyroid gland and thoracic inlet. No acute abnormality of the trachea or esophagus. No worrisome mediastinal, hilar or axillary adenopathy. Lungs/Pleura: Mild airways thickening. No consolidation. No pneumothorax or visible effusion. No concerning pulmonary nodules or masses. Musculoskeletal: None Review of the MIP images confirms the above findings. CT ABDOMEN and PELVIS FINDINGS Hepatobiliary: No worrisome focal liver abnormality is seen. Normal gallbladder. No visible calcified gallstones. No biliary ductal dilatation. Pancreas: No pancreatic ductal dilatation or surrounding inflammatory changes. Spleen: Normal in size. No concerning splenic lesions. Adrenals/Urinary Tract: Normal adrenals. Abdomen and pelvis is imaged in the  late nephrographic/excretory phase. Suspect some faint striations involving the upper pole right renal nephrogram with a  wedge-shaped region of hypoattenuation and possible collection in the upper pole (6/52, 3/21) measuring up to 1.6 x 1.5 x 1.7 cm in size. No hydronephrosis. No urolithiasis. Urinary bladder is unremarkable for the degree of distention. Stomach/Bowel: Distal esophagus, stomach and duodenal sweep are unremarkable. No small bowel wall thickening or dilatation. No evidence of obstruction. A normal air-filled appendix is present in the right lower quadrant coursing in a retrocecal position from the cecal tip. No colonic dilatation or wall thickening. Vascular/Lymphatic: No significant vascular findings are present. No enlarged abdominal or pelvic lymph nodes though challenging evaluation of the mesentery given a paucity of intraperitoneal fat. Reproductive: Anteverted uterus deviated slightly leftward. No concerning adnexal lesions. Peripherally enhancing probable corpus luteum in the right ovary. Other: No abdominopelvic free air or fluid. No bowel containing hernias. Musculoskeletal: No acute osseous abnormality or suspicious osseous lesion. Review of the MIP images confirms the above findings. IMPRESSION: No evidence of pulmonary artery embolism. Mild diffuse airways thickening, can reflect acute or chronic reactive airways disease versus bronchitis. Wedge-shaped region of hypoattenuation in the upper pole right kidney with some questionable striations of the nephrogram. Recommend correlation with urinalysis to assess for ascending tract infection and possible renal abscess given a slightly volume positive appearance of this focus as well as the presenting symptoms. Alternative etiologies could include incidental renal cyst or mass versus infarct. Could consider a dedicated renal ultrasound for further characterization. Wedge-shaped soft tissue attenuation anterior mediastinum, favor thymic remnant with Electronically Signed   By: Kreg Shropshire M.D.   On: 03/15/2021 22:15   US Abdomen Limited RUQ (LIVER/GB)  Result  Date: 03/15/2021 CLINICAL DATA:  Right upper quadrant pain EXAM: ULTRASOUND ABDOMEN LIMITED RIGHT UPPER QUADRANT COMPARISON:  None. FINDINGS: Gallbladder: No gallstones or wall thickening visualized. No sonographic Murphy sign noted by sonographer. Common bile duct: Diameter: 2 mm, normal Liver: No focal lesion identified. Within normal limits in parenchymal echogenicity. Portal vein is patent on color Doppler imaging with normal direction of blood flow towards the liver. Other: None. IMPRESSION: Normal examination. Electronically Signed   By: Burman Nieves M.D.   On: 03/15/2021 19:10    EKG: I independently viewed the EKG done and my findings are as followed: Sinus tachycardia rate of 107.  Nonspecific ST-T changes.  QTc 440.  Assessment/Plan Present on Admission:  Pyelonephritis  Active Problems:   Pyelonephritis  Sepsis secondary to R pyelonephritis with concern for right renal abscess seen on CT scan Presented with fever T-max 102.2, tachypnea with respiratory rate 28, positive UA, concern for right pyelonephritis and possible right renal abscess on CT abdomen pelvis with contrast Code sepsis called in the ED, urine culture and blood cultures obtained, follow results. Received Rocephin empirically in the ED, switched to Zosyn due to possible renal abscess. Gentle IV fluid hydration LR at 50 cc/h x 2 days. Monitor fever curve and WBC Repeat CBC in the morning. Obtain MRSA screening, add coverage if positive.  Atypical chest pain First 2 sets of troponin negative No evidence of acute ischemia on 12 EKG. Continue to monitor on telemetry  Anion gap metabolic acidosis in the setting of sepsis Presented with serum bicarb of 20 with anion gap of 14 Obtain lactic acid in the morning. Continue IV fluid hydration  Sinus tachycardia in the setting of sepsis IV fluid hydration Treat underlying condition.  Polysubstance abuse including alcohol and marijuana Obtain UDS  Cessation  counseling  Asthma Not currently in exacerbation DuoNebs every 2 hours as needed for shortness of breath and wheezing.   DVT prophylaxis: Subcu Lovenox daily  Code Status: Full code.  Family Communication: None at bedside.  Disposition Plan: Admit to telemetry medical  Consults called: IR, EDP discussed case with urology   Admission status: Inpatient status.  Patient will require at least 2 midnights for further evaluation and treatment of present condition.   Status is: Inpatient    Dispo:  Patient From: Home  Planned Disposition: Home possibly on 03/17/2021.  Medically stable for discharge: No         Darlin Drop MD [facility] Pager [phone]  If 7PM-7AM, please contact night-coverage [email or web address] Password [facility]  03/16/2021, 12:13 AM

## 2021-03-16 NOTE — Progress Notes (Signed)
Pt arrived to 6N04 from 5W and oriented to room and dept.  Addressed any concerns and ordered tray.

## 2021-03-16 NOTE — Progress Notes (Signed)
PROGRESS NOTE        PATIENT DETAILS Name: Cassidy Lawrence Age: 20 y.o. Sex: female Date of Birth: 01-May-2001 Admit Date: 03/15/2021 Admitting Physician Darlin Drop, DO [id number], No Pcp Per (Inactive)  Brief Narrative: Patient is a 20 y.o. female  Significant events: 6/15>> admit for fever/right flank pain-likely pyelonephritis.  Significant studies: 6/15>>> CTA chest: No PE 6/15>> CT abdomen/pelvis: Discussed with radiology on 6/16-likely right-sided pyelonephritis-unlikely to be abscess.  Antimicrobial therapy: Rocephin: 6/15>>  Microbiology data: 6/15>> COVID/influenza PCR: Negative 6/15>> blood culture: Pending 6/15>> urine culture: Pending  Procedures : None  Consults: None  DVT Prophylaxis : enoxaparin (LOVENOX) injection 40 mg Start: 03/16/21 1000 SCDs Start: 03/16/21 0011  Subjective: Continues to have right flank pain-afebrile this morning.  Assessment/Plan: Sepsis due to right-sided pyelonephritis: Sepsis physiology improving-remains on IV Rocephin-await culture data.  Discussed with radiologist over the phone-CT findings more consistent with pyelonephritis than abscess.  Recommendations are to pursue further imaging-if patient does not clinically defervesce.  Asthma: Stable-continue with as needed bronchodilators  Hypokalemia: Replete and recheck.  UDS positive for cannabinoids: We will need counseling going forward.  Diet: Diet Order             Diet regular Room service appropriate? Yes; Fluid consistency: Thin  Diet effective now                    Code Status: Full code   Family Communication: None at bedside.  Disposition Plan: Status is: Inpatient  Remains inpatient appropriate because:Inpatient level of care appropriate due to severity of illness  Dispo:  Patient From: Home  Planned Disposition: Home  Medically stable for discharge: No      Barriers to Discharge: Resolving sepsis  physiology-right-sided pyelonephritis-need further improvement in clinical status-and culture data before safe discharge.  Antimicrobial agents: Anti-infectives (From admission, onward)    Start     Dose/Rate Route Frequency Ordered Stop   03/16/21 0200  piperacillin-tazobactam (ZOSYN) IVPB 3.375 g        3.375 g 12.5 mL/hr over 240 Minutes Intravenous Every 8 hours 03/16/21 0033     03/16/21 0015  cefTRIAXone (ROCEPHIN) 2 g in sodium chloride 0.9 % 100 mL IVPB  Status:  Discontinued        2 g 200 mL/hr over 30 Minutes Intravenous Every 24 hours 03/16/21 0006 03/16/21 0012   03/15/21 2315  cefTRIAXone (ROCEPHIN) 1 g in sodium chloride 0.9 % 100 mL IVPB  Status:  Discontinued        1 g 200 mL/hr over 30 Minutes Intravenous  Once 03/15/21 2300 03/16/21 0012        Time spent: 35 minutes-Greater than 50% of this time was spent in counseling, explanation of diagnosis, planning of further management, and coordination of care.  MEDICATIONS: Scheduled Meds:  enoxaparin (LOVENOX) injection  40 mg Subcutaneous Daily   Continuous Infusions:  lactated ringers 50 mL/hr at 03/16/21 0045   piperacillin-tazobactam (ZOSYN)  IV 3.375 g (03/16/21 0242)   PRN Meds:.acetaminophen, ipratropium-albuterol, melatonin, oxyCODONE, polyethylene glycol   PHYSICAL EXAM: Vital signs: Vitals:   03/16/21 0306 03/16/21 0313 03/16/21 0806 03/16/21 1211  BP: 116/76  115/71 126/80  Pulse: 88 83 84 87  Resp: 17 (!) 24 20 20   Temp: 98 F (36.7 C)  99.8 F (37.7 C) 98.2 F (36.8 C)  TempSrc:  Oral  Oral Oral  SpO2: 100% 100% 100% 100%   There were no vitals filed for this visit. There is no height or weight on file to calculate BMI.   Gen Exam:Alert awake-not in any distress HEENT:atraumatic, normocephalic Chest: B/L clear to auscultation anteriorly CVS:S1S2 regular Abdomen:soft non tender, non distended-right CVA tenderness. Extremities:no edema Neurology: Non focal Skin: no rash  I have  personally reviewed following labs and imaging studies  LABORATORY DATA: CBC: Recent Labs  Lab 03/15/21 1234 03/16/21 0011 03/16/21 0542  WBC 8.2 8.6 7.7  NEUTROABS 6.6  --   --   HGB 13.2 11.5* 11.3*  HCT 39.3 33.3* 32.5*  MCV 85.6 85.2 84.4  PLT 275 227 217    Basic Metabolic Panel: Recent Labs  Lab 03/15/21 1234 03/16/21 0011 03/16/21 0542  NA 133*  --  135  K 3.6  --  3.3*  CL 99  --  107  CO2 20*  --  20*  GLUCOSE 93  --  101*  BUN <5*  --  <5*  CREATININE 0.91 0.81 0.71  CALCIUM 9.9  --  8.0*  MG  --   --  1.9  PHOS  --   --  2.6    GFR: CrCl cannot be calculated (Unknown ideal weight.).  Liver Function Tests: Recent Labs  Lab 03/15/21 1234  AST 25  ALT 19  ALKPHOS 58  BILITOT 1.2  PROT 8.6*  ALBUMIN 4.6   Recent Labs  Lab 03/15/21 1234  LIPASE 23   No results for input(s): AMMONIA in the last 168 hours.  Coagulation Profile: No results for input(s): INR, PROTIME in the last 168 hours.  Cardiac Enzymes: No results for input(s): CKTOTAL, CKMB, CKMBINDEX, TROPONINI in the last 168 hours.  BNP (last 3 results) No results for input(s): PROBNP in the last 8760 hours.  Lipid Profile: No results for input(s): CHOL, HDL, LDLCALC, TRIG, CHOLHDL, LDLDIRECT in the last 72 hours.  Thyroid Function Tests: No results for input(s): TSH, T4TOTAL, FREET4, T3FREE, THYROIDAB in the last 72 hours.  Anemia Panel: No results for input(s): VITAMINB12, FOLATE, FERRITIN, TIBC, IRON, RETICCTPCT in the last 72 hours.  Urine analysis:    Component Value Date/Time   COLORURINE YELLOW 03/15/2021 2132   APPEARANCEUR HAZY (A) 03/15/2021 2132   LABSPEC >1.046 (H) 03/15/2021 2132   PHURINE 6.0 03/15/2021 2132   GLUCOSEU NEGATIVE 03/15/2021 2132   HGBUR MODERATE (A) 03/15/2021 2132   BILIRUBINUR NEGATIVE 03/15/2021 2132   KETONESUR 80 (A) 03/15/2021 2132   PROTEINUR NEGATIVE 03/15/2021 2132   UROBILINOGEN 1.0 09/30/2020 1050   NITRITE NEGATIVE 03/15/2021 2132    LEUKOCYTESUR SMALL (A) 03/15/2021 2132    Sepsis Labs: Lactic Acid, Venous No results found for: LATICACIDVEN  MICROBIOLOGY: Recent Results (from the past 240 hour(s))  Resp Panel by RT-PCR (Flu A&B, Covid) Nasopharyngeal Swab     Status: None   Collection Time: 03/15/21  8:02 PM   Specimen: Nasopharyngeal Swab; Nasopharyngeal(NP) swabs in vial transport medium  Result Value Ref Range Status   SARS Coronavirus 2 by RT PCR NEGATIVE NEGATIVE Final    Comment: (NOTE) SARS-CoV-2 target nucleic acids are NOT DETECTED.  The SARS-CoV-2 RNA is generally detectable in upper respiratory specimens during the acute phase of infection. The lowest concentration of SARS-CoV-2 viral copies this assay can detect is 138 copies/mL. A negative result does not preclude SARS-Cov-2 infection and should not be used as the sole basis for treatment or other patient management decisions.  A negative result may occur with  improper specimen collection/handling, submission of specimen other than nasopharyngeal swab, presence of viral mutation(s) within the areas targeted by this assay, and inadequate number of viral copies(<138 copies/mL). A negative result must be combined with clinical observations, patient history, and epidemiological information. The expected result is Negative.  Fact Sheet for Patients:  BloggerCourse.com  Fact Sheet for Healthcare Providers:  SeriousBroker.it  This test is no t yet approved or cleared by the [place] FDA and  has been authorized for detection and/or diagnosis of SARS-CoV-2 by FDA under an Emergency Use Authorization (EUA). This EUA will remain  in effect (meaning this test can be used) for the duration of the COVID-19 declaration under Section 564(b)(1) of the Act, 21 U.S.C.section 360bbb-3(b)(1), unless the authorization is terminated  or revoked sooner.       Influenza A by PCR NEGATIVE NEGATIVE Final    Influenza B by PCR NEGATIVE NEGATIVE Final    Comment: (NOTE) The Xpert Xpress SARS-CoV-2/FLU/RSV plus assay is intended as an aid in the diagnosis of influenza from Nasopharyngeal swab specimens and should not be used as a sole basis for treatment. Nasal washings and aspirates are unacceptable for Xpert Xpress SARS-CoV-2/FLU/RSV testing.  Fact Sheet for Patients: BloggerCourse.com  Fact Sheet for Healthcare Providers: SeriousBroker.it  This test is not yet approved or cleared by the [place] FDA and has been authorized for detection and/or diagnosis of SARS-CoV-2 by FDA under an Emergency Use Authorization (EUA). This EUA will remain in effect (meaning this test can be used) for the duration of the COVID-19 declaration under Section 564(b)(1) of the Act, 21 U.S.C. section 360bbb-3(b)(1), unless the authorization is terminated or revoked.  Performed at [facility], [address], [place], [place] [address]   MRSA PCR Screening     Status: None   Collection Time: 03/16/21  4:44 AM  Result Value Ref Range Status   MRSA by PCR NEGATIVE NEGATIVE Final    Comment:        The GeneXpert MRSA Assay (FDA approved for NASAL specimens only), is one component of a comprehensive MRSA colonization surveillance program. It is not intended to diagnose MRSA infection nor to guide or monitor treatment for MRSA infections. Performed at [facility], [address] [place], [place] [address]     RADIOLOGY STUDIES/RESULTS: DG Chest 2 View  Result Date: 03/15/2021 CLINICAL DATA:  Chest pain and shortness of breath EXAM: CHEST - 2 VIEW COMPARISON:  None. FINDINGS: Lungs are clear. Heart size and pulmonary vascularity are normal. No adenopathy. No pneumothorax. No bone lesions. IMPRESSION: Lungs clear.  Cardiac silhouette normal. Electronically Signed   By: Bretta Bang III M.D.   On: 03/15/2021 13:27   CT Angio  Chest PE W/Cm &/Or Wo Cm  Result Date: 03/15/2021 CLINICAL DATA:  Shortness of breath, right abdominal pain, chest tightness and nausea for 1 day, history of asthma EXAM: CT ANGIOGRAPHY CHEST CT ABDOMEN AND PELVIS WITH CONTRAST TECHNIQUE: Multidetector CT imaging of the chest was performed using the standard protocol during bolus administration of intravenous contrast. Multiplanar CT image reconstructions and MIPs were obtained to evaluate the vascular anatomy. Multidetector CT imaging of the abdomen and pelvis was performed using the standard protocol during bolus administration of intravenous contrast. CONTRAST:  OMNIPAQUE IOHEXOL 350 MG/ML SOLN COMPARISON:  None. FINDINGS: CTA CHEST FINDINGS Cardiovascular: Satisfactory opacification the pulmonary arteries to the segmental level. No pulmonary artery filling defects are identified. Central  pulmonary arteries are normal caliber. Normal heart size. No pericardial effusion. The aortic root is suboptimally assessed given cardiac pulsation artifact. The aorta is normal caliber. No acute luminal abnormality of the imaged aorta. No periaortic stranding or hemorrhage. Shared origin of the brachiocephalic and left common carotid arteries. Proximal great vessels are otherwise unremarkable. Mediastinum/Nodes: Wedge-shaped soft tissue attenuation in the anterior mediastinum, nonspecific though favored to reflect a thymic remnant in a patient of this age. No mediastinal fluid or gas. Normal thyroid gland and thoracic inlet. No acute abnormality of the trachea or esophagus. No worrisome mediastinal, hilar or axillary adenopathy. Lungs/Pleura: Mild airways thickening. No consolidation. No pneumothorax or visible effusion. No concerning pulmonary nodules or masses. Musculoskeletal: None Review of the MIP images confirms the above findings. CT ABDOMEN and PELVIS FINDINGS Hepatobiliary: No worrisome focal liver abnormality is seen. Normal gallbladder. No visible calcified  gallstones. No biliary ductal dilatation. Pancreas: No pancreatic ductal dilatation or surrounding inflammatory changes. Spleen: Normal in size. No concerning splenic lesions. Adrenals/Urinary Tract: Normal adrenals. Abdomen and pelvis is imaged in the late nephrographic/excretory phase. Suspect some faint striations involving the upper pole right renal nephrogram with a wedge-shaped region of hypoattenuation and possible collection in the upper pole (6/52, 3/21) measuring up to 1.6 x 1.5 x 1.7 cm in size. No hydronephrosis. No urolithiasis. Urinary bladder is unremarkable for the degree of distention. Stomach/Bowel: Distal esophagus, stomach and duodenal sweep are unremarkable. No small bowel wall thickening or dilatation. No evidence of obstruction. A normal air-filled appendix is present in the right lower quadrant coursing in a retrocecal position from the cecal tip. No colonic dilatation or wall thickening. Vascular/Lymphatic: No significant vascular findings are present. No enlarged abdominal or pelvic lymph nodes though challenging evaluation of the mesentery given a paucity of intraperitoneal fat. Reproductive: Anteverted uterus deviated slightly leftward. No concerning adnexal lesions. Peripherally enhancing probable corpus luteum in the right ovary. Other: No abdominopelvic free air or fluid. No bowel containing hernias. Musculoskeletal: No acute osseous abnormality or suspicious osseous lesion. Review of the MIP images confirms the above findings. IMPRESSION: No evidence of pulmonary artery embolism. Mild diffuse airways thickening, can reflect acute or chronic reactive airways disease versus bronchitis. Wedge-shaped region of hypoattenuation in the upper pole right kidney with some questionable striations of the nephrogram. Recommend correlation with urinalysis to assess for ascending tract infection and possible renal abscess given a slightly volume positive appearance of this focus as well as the  presenting symptoms. Alternative etiologies could include incidental renal cyst or mass versus infarct. Could consider a dedicated renal ultrasound for further characterization. Wedge-shaped soft tissue attenuation anterior mediastinum, favor thymic remnant with Electronically Signed   By: Kreg ShropshirePrice  DeHay M.D.   On: 03/15/2021 22:15   CT ABDOMEN PELVIS W CONTRAST  Result Date: 03/15/2021 CLINICAL DATA:  Shortness of breath, right abdominal pain, chest tightness and nausea for 1 day, history of asthma EXAM: CT ANGIOGRAPHY CHEST CT ABDOMEN AND PELVIS WITH CONTRAST TECHNIQUE: Multidetector CT imaging of the chest was performed using the standard protocol during bolus administration of intravenous contrast. Multiplanar CT image reconstructions and MIPs were obtained to evaluate the vascular anatomy. Multidetector CT imaging of the abdomen and pelvis was performed using the standard protocol during bolus administration of intravenous contrast. CONTRAST:  100mL OMNIPAQUE IOHEXOL 350 MG/ML SOLN COMPARISON:  None. FINDINGS: CTA CHEST FINDINGS Cardiovascular: Satisfactory opacification the pulmonary arteries to the segmental level. No pulmonary artery filling defects are identified. Central pulmonary arteries are normal caliber. Normal  heart size. No pericardial effusion. The aortic root is suboptimally assessed given cardiac pulsation artifact. The aorta is normal caliber. No acute luminal abnormality of the imaged aorta. No periaortic stranding or hemorrhage. Shared origin of the brachiocephalic and left common carotid arteries. Proximal great vessels are otherwise unremarkable. Mediastinum/Nodes: Wedge-shaped soft tissue attenuation in the anterior mediastinum, nonspecific though favored to reflect a thymic remnant in a patient of this age. No mediastinal fluid or gas. Normal thyroid gland and thoracic inlet. No acute abnormality of the trachea or esophagus. No worrisome mediastinal, hilar or axillary adenopathy.  Lungs/Pleura: Mild airways thickening. No consolidation. No pneumothorax or visible effusion. No concerning pulmonary nodules or masses. Musculoskeletal: None Review of the MIP images confirms the above findings. CT ABDOMEN and PELVIS FINDINGS Hepatobiliary: No worrisome focal liver abnormality is seen. Normal gallbladder. No visible calcified gallstones. No biliary ductal dilatation. Pancreas: No pancreatic ductal dilatation or surrounding inflammatory changes. Spleen: Normal in size. No concerning splenic lesions. Adrenals/Urinary Tract: Normal adrenals. Abdomen and pelvis is imaged in the late nephrographic/excretory phase. Suspect some faint striations involving the upper pole right renal nephrogram with a wedge-shaped region of hypoattenuation and possible collection in the upper pole (6/52, 3/21) measuring up to 1.6 x 1.5 x 1.7 cm in size. No hydronephrosis. No urolithiasis. Urinary bladder is unremarkable for the degree of distention. Stomach/Bowel: Distal esophagus, stomach and duodenal sweep are unremarkable. No small bowel wall thickening or dilatation. No evidence of obstruction. A normal air-filled appendix is present in the right lower quadrant coursing in a retrocecal position from the cecal tip. No colonic dilatation or wall thickening. Vascular/Lymphatic: No significant vascular findings are present. No enlarged abdominal or pelvic lymph nodes though challenging evaluation of the mesentery given a paucity of intraperitoneal fat. Reproductive: Anteverted uterus deviated slightly leftward. No concerning adnexal lesions. Peripherally enhancing probable corpus luteum in the right ovary. Other: No abdominopelvic free air or fluid. No bowel containing hernias. Musculoskeletal: No acute osseous abnormality or suspicious osseous lesion. Review of the MIP images confirms the above findings. IMPRESSION: No evidence of pulmonary artery embolism. Mild diffuse airways thickening, can reflect acute or chronic  reactive airways disease versus bronchitis. Wedge-shaped region of hypoattenuation in the upper pole right kidney with some questionable striations of the nephrogram. Recommend correlation with urinalysis to assess for ascending tract infection and possible renal abscess given a slightly volume positive appearance of this focus as well as the presenting symptoms. Alternative etiologies could include incidental renal cyst or mass versus infarct. Could consider a dedicated renal ultrasound for further characterization. Wedge-shaped soft tissue attenuation anterior mediastinum, favor thymic remnant with Electronically Signed   By: Kreg Shropshire M.D.   On: 03/15/2021 22:15   US Abdomen Limited RUQ (LIVER/GB)  Result Date: 03/15/2021 CLINICAL DATA:  Right upper quadrant pain EXAM: ULTRASOUND ABDOMEN LIMITED RIGHT UPPER QUADRANT COMPARISON:  None. FINDINGS: Gallbladder: No gallstones or wall thickening visualized. No sonographic Murphy sign noted by sonographer. Common bile duct: Diameter: 2 mm, normal Liver: No focal lesion identified. Within normal limits in parenchymal echogenicity. Portal vein is patent on color Doppler imaging with normal direction of blood flow towards the liver. Other: None. IMPRESSION: Normal examination. Electronically Signed   By: Burman Nieves M.D.   On: 03/15/2021 19:10     LOS: 0 days   Jeoffrey Massed, MD  [facility]    To contact the attending provider between 7A-7P or the covering provider during after hours 7P-7A, please log into the web site [email or web address]  and access using universal [place] password for that web site. If you do not have the password, please call the hospital operator.  03/16/2021, 12:21 PM

## 2021-03-16 NOTE — Plan of Care (Signed)

## 2021-03-17 ENCOUNTER — Inpatient Hospital Stay (HOSPITAL_COMMUNITY): Payer: Commercial Managed Care - PPO

## 2021-03-17 DIAGNOSIS — N12 Tubulo-interstitial nephritis, not specified as acute or chronic: Secondary | ICD-10-CM | POA: Diagnosis not present

## 2021-03-17 LAB — CBC
HCT: 32.3 % — ABNORMAL LOW (ref 36.0–46.0)
Hemoglobin: 11.1 g/dL — ABNORMAL LOW (ref 12.0–15.0)
MCH: 29.1 pg (ref 26.0–34.0)
MCHC: 34.4 g/dL (ref 30.0–36.0)
MCV: 84.8 fL (ref 80.0–100.0)
Platelets: 220 10*3/uL (ref 150–400)
RBC: 3.81 MIL/uL — ABNORMAL LOW (ref 3.87–5.11)
RDW: 13.2 % (ref 11.5–15.5)
WBC: 6 10*3/uL (ref 4.0–10.5)
nRBC: 0 % (ref 0.0–0.2)

## 2021-03-17 LAB — BASIC METABOLIC PANEL
Anion gap: 9 (ref 5–15)
BUN: <5 mg/dL — ABNORMAL LOW (ref 6–20)
CO2: 20 mmol/L — ABNORMAL LOW (ref 22–32)
Calcium: 8.9 mg/dL (ref 8.9–10.3)
Chloride: 104 mmol/L (ref 98–111)
Creatinine, Ser: 0.68 mg/dL (ref 0.44–1.00)
GFR, Estimated: >60 mL/min (ref >60)
Glucose, Bld: 112 mg/dL — ABNORMAL HIGH (ref 70–99)
Potassium: 3.6 mmol/L (ref 3.5–5.1)
Sodium: 133 mmol/L — ABNORMAL LOW (ref 135–145)

## 2021-03-17 LAB — URINE CULTURE: Culture: NO GROWTH

## 2021-03-17 MED ORDER — GADOBUTROL 1 MMOL/ML IV SOLN
6.0000 mL | Freq: Once | INTRAVENOUS | Status: AC | PRN
  Administered 2021-03-17: 6 mL via INTRAVENOUS

## 2021-03-17 MED ORDER — WHITE PETROLATUM EX OINT
TOPICAL_OINTMENT | CUTANEOUS | Status: AC
  Filled 2021-03-17: qty 28.35

## 2021-03-17 MED ORDER — SODIUM CHLORIDE 0.9 % IV SOLN
2.0000 g | INTRAVENOUS | Status: DC
  Administered 2021-03-17: 2 g via INTRAVENOUS
  Filled 2021-03-17: qty 20
  Filled 2021-03-17: qty 2

## 2021-03-17 MED ORDER — POTASSIUM CHLORIDE CRYS ER 20 MEQ PO TBCR: 40.0000 meq | EXTENDED_RELEASE_TABLET | Freq: Once | ORAL | Status: DC

## 2021-03-17 NOTE — Progress Notes (Signed)
PROGRESS NOTE        PATIENT DETAILS Name: Cassidy Lawrence Age: 20 y.o. Sex: female Date of Birth: November 14, 2000 Admit Date: 03/15/2021 Admitting Physician Darlin Drop, DO [id number], No Pcp Per (Inactive)  Brief Narrative: Patient is a 20 y.o. female with history of asthma-presented to the ED with fever-2-3-day history of urinary frequency-with concern for possible right-sided pyelonephritis.  See below for further details.  Significant events: 6/15>> admit for fever/right flank pain-likely pyelonephritis.  Significant studies: 6/15>>> CTA chest: No PE 6/15>> CT abdomen/pelvis: Wedge-shaped at attenuation in the right upper pole-subsequently discussed with radiology on 6/16-likely right-sided pyelonephritis-unlikely to be abscess. 6/15>> RUQ ultrasound-no gallstones-no wall thickening-no sonographic Murphy sign.  Antimicrobial therapy: Rocephin: 6/15>>  Microbiology data: 6/15>> COVID/influenza PCR: Negative 6/15>> blood culture: No growth 6/15>> urine culture: No growth  Procedures : None  Consults: None  DVT Prophylaxis : enoxaparin (LOVENOX) injection 40 mg Start: 03/16/21 1000 SCDs Start: 03/16/21 0011  Subjective: Afebrile-but continues to have right flank/right RUQ pain.  No dysuria-but did have frequency when she first presented to the hospital.  Multiple complaints (her partner at bedside-if videotaping this MD-I asked her to stop videotaping me)-including the fact that she was kept n.p.o. on admission for presumed renal ultrasound-and that this MD did not go forward with the ultrasound and she was kept n.p.o. for no reason.  She subsequently brought up other issues including-a blood stained bedsheet that was in her room in 5 W, she wanted to be transferred to another facility, that she had a mosquito bite while she was in the hospital, accused staff of being racist-as a patient across the hall from her was able to get/order food in 10  minutes and hers took several hours-she started crying when mentioning all of this.  She also was upset that vital signs were being checked every hours, and that phlebotomy was getting blood when she was sleeping etc.  I subsequently explained to her-the reason why her ultrasound was not done yesterday-subsequently explained to her that she really does not need to be transferred to another facility as she does not yet require a higher level of care (this was also explained to her yesterday evening by nurses in 5 W.).    Her nurse was at bedside with me this morning acting as a chaperone when I walked in, but due to her multiple complaints-I briefly left the room and got the 6 [address] department director as well-so that the nursing leadership was aware of patient's multiple complaints.   Assessment/Plan: Sepsis due to ?right-sided pyelonephritis: Sepsis physiology has resolved-blood and urine cultures have now come back negative.  She continues to have right flank/RUQ pain.  Since her cultures have come back negative-this MD reached out to radiology this morning again-spoke with Dr. Marlou Sa reviewed CT imaging-since patient continues to be symptomatic-advised that she could have a small cyst that may have bled.  He advised that we obtain a MRI of her kidneys with/without contrast to better evaluate.  He looked at her gallbladder/biliary tree-and he did not find any evidence consistent with cholecystitis.  Furthermore-she seems to be tolerating diet well without any vomiting related to food.  In the meantime-I will continue with IV Rocephin-pending further imaging/MRI.  Asthma: Stable-continue with as needed bronchodilators  Hypokalemia: Repleted.  UDS positive for cannabinoids: We will counsel-over the next few days.  Note-as noted above-patient was examined/interviewed with RN at bedside.  This MD went to her room x2-second time was to inform her that the cultures were negative and that MRI abdomen has  been ordered.  Diet: Diet Order             Diet regular Room service appropriate? Yes; Fluid consistency: Thin  Diet effective now                    Code Status: Full code   Family Communication: Partner at bedside  Disposition Plan: Status is: Inpatient  Remains inpatient appropriate because:Inpatient level of care appropriate due to severity of illness  Dispo:  Patient From: Home  Planned Disposition: Home  Medically stable for discharge: No      Barriers to Discharge: Diagnostic uncertainty-needs further evaluation of her kidneys to evaluate right flank pain.  Antimicrobial agents: Anti-infectives (From admission, onward)    Start     Dose/Rate Route Frequency Ordered Stop   03/17/21 0800  cefTRIAXone (ROCEPHIN) 2 g in sodium chloride 0.9 % 100 mL IVPB        2 g 200 mL/hr over 30 Minutes Intravenous Every 24 hours 03/17/21 0727     03/16/21 2200  cefTRIAXone (ROCEPHIN) 1 g in sodium chloride 0.9 % 100 mL IVPB  Status:  Discontinued        1 g 200 mL/hr over 30 Minutes Intravenous Every 24 hours 03/16/21 1656 03/17/21 0727   03/16/21 0200  piperacillin-tazobactam (ZOSYN) IVPB 3.375 g  Status:  Discontinued        3.375 g 12.5 mL/hr over 240 Minutes Intravenous Every 8 hours 03/16/21 0033 03/16/21 1656   03/16/21 0015  cefTRIAXone (ROCEPHIN) 2 g in sodium chloride 0.9 % 100 mL IVPB  Status:  Discontinued        2 g 200 mL/hr over 30 Minutes Intravenous Every 24 hours 03/16/21 0006 03/16/21 0012   03/15/21 2315  cefTRIAXone (ROCEPHIN) 1 g in sodium chloride 0.9 % 100 mL IVPB  Status:  Discontinued        1 g 200 mL/hr over 30 Minutes Intravenous  Once 03/15/21 2300 03/16/21 0012        Time spent: 35 minutes-Greater than 50% of this time was spent in counseling, explanation of diagnosis, planning of further management, and coordination of care.  MEDICATIONS: Scheduled Meds:  enoxaparin (LOVENOX) injection  40 mg Subcutaneous Daily   Continuous  Infusions:  cefTRIAXone (ROCEPHIN)  IV 2 g (03/17/21 0858)   lactated ringers 50 mL/hr at 03/16/21 1745   PRN Meds:.acetaminophen, ipratropium-albuterol, melatonin, oxyCODONE, polyethylene glycol   PHYSICAL EXAM: Vital signs: Vitals:   03/16/21 1816 03/16/21 2040 03/17/21 0156 03/17/21 0505  BP: 128/79 112/79 128/83 126/71  Pulse: 92 80 86 73  Resp: 19 16 16 15   Temp: 99.5 F (37.5 C) 99.6 F (37.6 C) 98.1 F (36.7 C) 98.9 F (37.2 C)  TempSrc: Oral Oral Axillary Oral  SpO2: 100% 100% 100% 100%   There were no vitals filed for this visit. There is no height or weight on file to calculate BMI.   Gen Exam:Alert awake-not in any distress HEENT:atraumatic, normocephalic Chest: B/L clear to auscultation anteriorly CVS:S1S2 regular Abdomen: Soft-right flank/RUQ area is tender-CVA angle is tender. Extremities:no edema Neurology: Non focal Skin: no rash   I have personally reviewed following labs and imaging studies  LABORATORY DATA: CBC: Recent Labs  Lab 03/15/21 1234 03/16/21 0011 03/16/21 0542 03/17/21 0141  WBC 8.2  8.6 7.7 6.0  NEUTROABS 6.6  --   --   --   HGB 13.2 11.5* 11.3* 11.1*  HCT 39.3 33.3* 32.5* 32.3*  MCV 85.6 85.2 84.4 84.8  PLT 275 227 217 220     Basic Metabolic Panel: Recent Labs  Lab 03/15/21 1234 03/16/21 0011 03/16/21 0542 03/17/21 0141  NA 133*  --  135 133*  K 3.6  --  3.3* 3.6  CL 99  --  107 104  CO2 20*  --  20* 20*  GLUCOSE 93  --  101* 112*  BUN <5*  --  <5* <5*  CREATININE 0.91 0.81 0.71 0.68  CALCIUM 9.9  --  8.0* 8.9  MG  --   --  1.9  --   PHOS  --   --  2.6  --      GFR: CrCl cannot be calculated (Unknown ideal weight.).  Liver Function Tests: Recent Labs  Lab 03/15/21 1234  AST 25  ALT 19  ALKPHOS 58  BILITOT 1.2  PROT 8.6*  ALBUMIN 4.6    Recent Labs  Lab 03/15/21 1234  LIPASE 23    No results for input(s): AMMONIA in the last 168 hours.  Coagulation Profile: No results for input(s): INR,  PROTIME in the last 168 hours.  Cardiac Enzymes: No results for input(s): CKTOTAL, CKMB, CKMBINDEX, TROPONINI in the last 168 hours.  BNP (last 3 results) No results for input(s): PROBNP in the last 8760 hours.  Lipid Profile: No results for input(s): CHOL, HDL, LDLCALC, TRIG, CHOLHDL, LDLDIRECT in the last 72 hours.  Thyroid Function Tests: No results for input(s): TSH, T4TOTAL, FREET4, T3FREE, THYROIDAB in the last 72 hours.  Anemia Panel: No results for input(s): VITAMINB12, FOLATE, FERRITIN, TIBC, IRON, RETICCTPCT in the last 72 hours.  Urine analysis:    Component Value Date/Time   COLORURINE YELLOW 03/15/2021 2132   APPEARANCEUR HAZY (A) 03/15/2021 2132   LABSPEC >1.046 (H) 03/15/2021 2132   PHURINE 6.0 03/15/2021 2132   GLUCOSEU NEGATIVE 03/15/2021 2132   HGBUR MODERATE (A) 03/15/2021 2132   BILIRUBINUR NEGATIVE 03/15/2021 2132   KETONESUR 80 (A) 03/15/2021 2132   PROTEINUR NEGATIVE 03/15/2021 2132   UROBILINOGEN 1.0 09/30/2020 1050   NITRITE NEGATIVE 03/15/2021 2132   LEUKOCYTESUR SMALL (A) 03/15/2021 2132    Sepsis Labs: Lactic Acid, Venous No results found for: LATICACIDVEN  MICROBIOLOGY: Recent Results (from the past 240 hour(s))  Resp Panel by RT-PCR (Flu A&B, Covid) Nasopharyngeal Swab     Status: None   Collection Time: 03/15/21  8:02 PM   Specimen: Nasopharyngeal Swab; Nasopharyngeal(NP) swabs in vial transport medium  Result Value Ref Range Status   SARS Coronavirus 2 by RT PCR NEGATIVE NEGATIVE Final    Comment: (NOTE) SARS-CoV-2 target nucleic acids are NOT DETECTED.  The SARS-CoV-2 RNA is generally detectable in upper respiratory specimens during the acute phase of infection. The lowest concentration of SARS-CoV-2 viral copies this assay can detect is 138 copies/mL. A negative result does not preclude SARS-Cov-2 infection and should not be used as the sole basis for treatment or other patient management decisions. A negative result may occur  with  improper specimen collection/handling, submission of specimen other than nasopharyngeal swab, presence of viral mutation(s) within the areas targeted by this assay, and inadequate number of viral copies(<138 copies/mL). A negative result must be combined with clinical observations, patient history, and epidemiological information. The expected result is Negative.  Fact Sheet for Patients:  BloggerCourse.com  Fact  Sheet for Healthcare Providers:  SeriousBroker.ithttps://www.fda.gov/media/152162/download  This test is no t yet approved or cleared by the [place] FDA and  has been authorized for detection and/or diagnosis of SARS-CoV-2 by FDA under an Emergency Use Authorization (EUA). This EUA will remain  in effect (meaning this test can be used) for the duration of the COVID-19 declaration under Section 564(b)(1) of the Act, 21 U.S.C.section 360bbb-3(b)(1), unless the authorization is terminated  or revoked sooner.       Influenza A by PCR NEGATIVE NEGATIVE Final   Influenza B by PCR NEGATIVE NEGATIVE Final    Comment: (NOTE) The Xpert Xpress SARS-CoV-2/FLU/RSV plus assay is intended as an aid in the diagnosis of influenza from Nasopharyngeal swab specimens and should not be used as a sole basis for treatment. Nasal washings and aspirates are unacceptable for Xpert Xpress SARS-CoV-2/FLU/RSV testing.  Fact Sheet for Patients: BloggerCourse.comhttps://www.fda.gov/media/152166/download  Fact Sheet for Healthcare Providers: SeriousBroker.ithttps://www.fda.gov/media/152162/download  This test is not yet approved or cleared by the [place] FDA and has been authorized for detection and/or diagnosis of SARS-CoV-2 by FDA under an Emergency Use Authorization (EUA). This EUA will remain in effect (meaning this test can be used) for the duration of the COVID-19 declaration under Section 564(b)(1) of the Act, 21 U.S.C. section 360bbb-3(b)(1), unless the authorization is terminated  or revoked.  Performed at [facility], [address] [place], [place] [address]   Blood culture (routine x 2)     Status: None (Preliminary result)   Collection Time: 03/15/21 11:05 PM   Specimen: BLOOD  Result Value Ref Range Status   Specimen Description BLOOD RIGHT ANTECUBITAL  Final   Special Requests   Final    BOTTLES DRAWN AEROBIC AND ANAEROBIC Blood Culture adequate volume   Culture   Final    NO GROWTH 1 DAY Performed at [facility], [address] [place], [place] [address]    Report Status PENDING  Incomplete  Blood culture (routine x 2)     Status: None (Preliminary result)   Collection Time: 03/15/21 11:10 PM   Specimen: BLOOD  Result Value Ref Range Status   Specimen Description BLOOD LEFT ANTECUBITAL  Final   Special Requests   Final    BOTTLES DRAWN AEROBIC AND ANAEROBIC Blood Culture adequate volume   Culture   Final    NO GROWTH 1 DAY Performed at [facility], [address] [place], [place] [address]    Report Status PENDING  Incomplete  Urine culture     Status: None   Collection Time: 03/15/21 11:25 PM   Specimen: Urine, Random  Result Value Ref Range Status   Specimen Description URINE, RANDOM  Final   Special Requests NONE  Final   Culture   Final    NO GROWTH Performed at [facility], [address] [place], [place] [address]    Report Status 03/17/2021 FINAL  Final  MRSA PCR Screening     Status: None   Collection Time: 03/16/21  4:44 AM  Result Value Ref Range Status   MRSA by PCR NEGATIVE NEGATIVE Final    Comment:        The GeneXpert MRSA Assay (FDA approved for NASAL specimens only), is one component of a comprehensive MRSA colonization surveillance program. It is not intended to diagnose MRSA infection nor to guide or monitor treatment for MRSA infections. Performed at [facility], [address] [place], [place] [address]  RADIOLOGY STUDIES/RESULTS: DG Chest 2 View  Result Date:  03/15/2021 CLINICAL DATA:  Chest pain and shortness of breath EXAM: CHEST - 2 VIEW COMPARISON:  None. FINDINGS: Lungs are clear. Heart size and pulmonary vascularity are normal. No adenopathy. No pneumothorax. No bone lesions. IMPRESSION: Lungs clear.  Cardiac silhouette normal. Electronically Signed   By: Bretta Bang III M.D.   On: 03/15/2021 13:27   CT Angio Chest PE W/Cm &/Or Wo Cm  Result Date: 03/15/2021 CLINICAL DATA:  Shortness of breath, right abdominal pain, chest tightness and nausea for 1 day, history of asthma EXAM: CT ANGIOGRAPHY CHEST CT ABDOMEN AND PELVIS WITH CONTRAST TECHNIQUE: Multidetector CT imaging of the chest was performed using the standard protocol during bolus administration of intravenous contrast. Multiplanar CT image reconstructions and MIPs were obtained to evaluate the vascular anatomy. Multidetector CT imaging of the abdomen and pelvis was performed using the standard protocol during bolus administration of intravenous contrast. CONTRAST:  OMNIPAQUE IOHEXOL 350 MG/ML SOLN COMPARISON:  None. FINDINGS: CTA CHEST FINDINGS Cardiovascular: Satisfactory opacification the pulmonary arteries to the segmental level. No pulmonary artery filling defects are identified. Central pulmonary arteries are normal caliber. Normal heart size. No pericardial effusion. The aortic root is suboptimally assessed given cardiac pulsation artifact. The aorta is normal caliber. No acute luminal abnormality of the imaged aorta. No periaortic stranding or hemorrhage. Shared origin of the brachiocephalic and left common carotid arteries. Proximal great vessels are otherwise unremarkable. Mediastinum/Nodes: Wedge-shaped soft tissue attenuation in the anterior mediastinum, nonspecific though favored to reflect a thymic remnant in a patient of this age. No mediastinal fluid or gas. Normal thyroid gland and thoracic inlet. No acute abnormality of the trachea or esophagus. No worrisome mediastinal, hilar  or axillary adenopathy. Lungs/Pleura: Mild airways thickening. No consolidation. No pneumothorax or visible effusion. No concerning pulmonary nodules or masses. Musculoskeletal: None Review of the MIP images confirms the above findings. CT ABDOMEN and PELVIS FINDINGS Hepatobiliary: No worrisome focal liver abnormality is seen. Normal gallbladder. No visible calcified gallstones. No biliary ductal dilatation. Pancreas: No pancreatic ductal dilatation or surrounding inflammatory changes. Spleen: Normal in size. No concerning splenic lesions. Adrenals/Urinary Tract: Normal adrenals. Abdomen and pelvis is imaged in the late nephrographic/excretory phase. Suspect some faint striations involving the upper pole right renal nephrogram with a wedge-shaped region of hypoattenuation and possible collection in the upper pole (6/52, 3/21) measuring up to 1.6 x 1.5 x 1.7 cm in size. No hydronephrosis. No urolithiasis. Urinary bladder is unremarkable for the degree of distention. Stomach/Bowel: Distal esophagus, stomach and duodenal sweep are unremarkable. No small bowel wall thickening or dilatation. No evidence of obstruction. A normal air-filled appendix is present in the right lower quadrant coursing in a retrocecal position from the cecal tip. No colonic dilatation or wall thickening. Vascular/Lymphatic: No significant vascular findings are present. No enlarged abdominal or pelvic lymph nodes though challenging evaluation of the mesentery given a paucity of intraperitoneal fat. Reproductive: Anteverted uterus deviated slightly leftward. No concerning adnexal lesions. Peripherally enhancing probable corpus luteum in the right ovary. Other: No abdominopelvic free air or fluid. No bowel containing hernias. Musculoskeletal: No acute osseous abnormality or suspicious osseous lesion. Review of the MIP images confirms the above findings. IMPRESSION: No evidence of pulmonary artery embolism. Mild diffuse airways thickening, can  reflect acute or chronic reactive airways disease versus bronchitis. Wedge-shaped region of hypoattenuation in the upper pole right kidney with some questionable striations of the nephrogram. Recommend correlation with urinalysis to assess for ascending  tract infection and possible renal abscess given a slightly volume positive appearance of this focus as well as the presenting symptoms. Alternative etiologies could include incidental renal cyst or mass versus infarct. Could consider a dedicated renal ultrasound for further characterization. Wedge-shaped soft tissue attenuation anterior mediastinum, favor thymic remnant with Electronically Signed   By: Kreg Shropshire M.D.   On: 03/15/2021 22:15   CT ABDOMEN PELVIS W CONTRAST  Result Date: 03/15/2021 CLINICAL DATA:  Shortness of breath, right abdominal pain, chest tightness and nausea for 1 day, history of asthma EXAM: CT ANGIOGRAPHY CHEST CT ABDOMEN AND PELVIS WITH CONTRAST TECHNIQUE: Multidetector CT imaging of the chest was performed using the standard protocol during bolus administration of intravenous contrast. Multiplanar CT image reconstructions and MIPs were obtained to evaluate the vascular anatomy. Multidetector CT imaging of the abdomen and pelvis was performed using the standard protocol during bolus administration of intravenous contrast. CONTRAST:  OMNIPAQUE IOHEXOL 350 MG/ML SOLN COMPARISON:  None. FINDINGS: CTA CHEST FINDINGS Cardiovascular: Satisfactory opacification the pulmonary arteries to the segmental level. No pulmonary artery filling defects are identified. Central pulmonary arteries are normal caliber. Normal heart size. No pericardial effusion. The aortic root is suboptimally assessed given cardiac pulsation artifact. The aorta is normal caliber. No acute luminal abnormality of the imaged aorta. No periaortic stranding or hemorrhage. Shared origin of the brachiocephalic and left common carotid arteries. Proximal great vessels are  otherwise unremarkable. Mediastinum/Nodes: Wedge-shaped soft tissue attenuation in the anterior mediastinum, nonspecific though favored to reflect a thymic remnant in a patient of this age. No mediastinal fluid or gas. Normal thyroid gland and thoracic inlet. No acute abnormality of the trachea or esophagus. No worrisome mediastinal, hilar or axillary adenopathy. Lungs/Pleura: Mild airways thickening. No consolidation. No pneumothorax or visible effusion. No concerning pulmonary nodules or masses. Musculoskeletal: None Review of the MIP images confirms the above findings. CT ABDOMEN and PELVIS FINDINGS Hepatobiliary: No worrisome focal liver abnormality is seen. Normal gallbladder. No visible calcified gallstones. No biliary ductal dilatation. Pancreas: No pancreatic ductal dilatation or surrounding inflammatory changes. Spleen: Normal in size. No concerning splenic lesions. Adrenals/Urinary Tract: Normal adrenals. Abdomen and pelvis is imaged in the late nephrographic/excretory phase. Suspect some faint striations involving the upper pole right renal nephrogram with a wedge-shaped region of hypoattenuation and possible collection in the upper pole (6/52, 3/21) measuring up to 1.6 x 1.5 x 1.7 cm in size. No hydronephrosis. No urolithiasis. Urinary bladder is unremarkable for the degree of distention. Stomach/Bowel: Distal esophagus, stomach and duodenal sweep are unremarkable. No small bowel wall thickening or dilatation. No evidence of obstruction. A normal air-filled appendix is present in the right lower quadrant coursing in a retrocecal position from the cecal tip. No colonic dilatation or wall thickening. Vascular/Lymphatic: No significant vascular findings are present. No enlarged abdominal or pelvic lymph nodes though challenging evaluation of the mesentery given a paucity of intraperitoneal fat. Reproductive: Anteverted uterus deviated slightly leftward. No concerning adnexal lesions. Peripherally enhancing  probable corpus luteum in the right ovary. Other: No abdominopelvic free air or fluid. No bowel containing hernias. Musculoskeletal: No acute osseous abnormality or suspicious osseous lesion. Review of the MIP images confirms the above findings. IMPRESSION: No evidence of pulmonary artery embolism. Mild diffuse airways thickening, can reflect acute or chronic reactive airways disease versus bronchitis. Wedge-shaped region of hypoattenuation in the upper pole right kidney with some questionable striations of the nephrogram. Recommend correlation with urinalysis to assess for ascending tract infection and possible renal abscess  given a slightly volume positive appearance of this focus as well as the presenting symptoms. Alternative etiologies could include incidental renal cyst or mass versus infarct. Could consider a dedicated renal ultrasound for further characterization. Wedge-shaped soft tissue attenuation anterior mediastinum, favor thymic remnant with Electronically Signed   By: Kreg Shropshire M.D.   On: 03/15/2021 22:15   US Abdomen Limited RUQ (LIVER/GB)  Result Date: 03/15/2021 CLINICAL DATA:  Right upper quadrant pain EXAM: ULTRASOUND ABDOMEN LIMITED RIGHT UPPER QUADRANT COMPARISON:  None. FINDINGS: Gallbladder: No gallstones or wall thickening visualized. No sonographic Murphy sign noted by sonographer. Common bile duct: Diameter: 2 mm, normal Liver: No focal lesion identified. Within normal limits in parenchymal echogenicity. Portal vein is patent on color Doppler imaging with normal direction of blood flow towards the liver. Other: None. IMPRESSION: Normal examination. Electronically Signed   By: Burman Nieves M.D.   On: 03/15/2021 19:10     LOS: 1 day   Jeoffrey Massed, MD  [facility]    To contact the attending provider between 7A-7P or the covering provider during after hours 7P-7A, please log into the web site [email or web address] and access using universal [place] password for that  web site. If you do not have the password, please call the hospital operator.  03/17/2021, 10:12 AM

## 2021-03-17 NOTE — Progress Notes (Signed)
Received patient for care. Patient and family in hallway appearing to be upset and agitated. Encouraged patient/family to return to room to discuss issue. Patient very upset stating that she has been "neglected all day". Offered patient pain medication for complaints of pain (see MAR). Attempted to discuss plan of care with patient. Patient stated that she no longer wanted to stay in hospital. I explained to patient and family what leaving the hospital against medical advice meant. Patient and family verbalized understanding. IV removed, patient left hospital with family member and significant other. Patient returned a few minutes later to retrieve her blanket that she left behind. Room checked for more personal belongings, none seen. On call made aware of patients AMA discharge.

## 2021-03-21 LAB — CULTURE, BLOOD (ROUTINE X 2)
Culture: NO GROWTH
Culture: NO GROWTH
Special Requests: ADEQUATE
Special Requests: ADEQUATE

## 2021-03-23 NOTE — Discharge Summary (Signed)
PATIENT DETAILS Name: Cassidy Lawrence Age: 20 y.o. Sex: female Date of Birth: 2001/02/03 MRN: [id number]. Admitting Physician: Darlin Drop, DO [facility], No Pcp Per (Inactive)  Admit Date: 03/15/2021 Discharge date: 03/17/2021  Note:patient left AMA  Recommendations for Outpatient Follow-up:  Please repeat CBC/BMET at next visit Please follow blood/urine cultures till final  PRIMARY DISCHARGE DIAGNOSIS:  Active Problems:   Pyelonephritis      PAST MEDICAL HISTORY: Past Medical History:  Diagnosis Date   Asthma     ALLERGIES:  No Known Allergies  BRIEFSUMMARY Brief Narrative: Patient is a 20 y.o. female with history of asthma-presented to the ED with fever-2-3-day history of urinary frequency-with concern for possible right-sided pyelonephritis.  See below for further details.   Significant events: 6/15>> admit for fever/right flank pain-likely pyelonephritis.   Significant studies: 6/15>>> CTA chest: No PE 6/15>> CT abdomen/pelvis: Wedge-shaped at attenuation in the right upper pole-subsequently discussed with radiology on 6/16-likely right-sided pyelonephritis-unlikely to be abscess. 6/15>> RUQ ultrasound-no gallstones-no wall thickening-no sonographic Murphy sign.   Antimicrobial therapy: Rocephin: 6/15>>   Microbiology data: 6/15>> COVID/influenza PCR: Negative 6/15>> blood culture: No growth 6/15>> urine culture: No growth   Procedures : None   Consults: None    HOSPITAL COURSE BY PROBLEM LIST:  Sepsis due to presumed right-sided pyelonephritis: She was admitted with fever/right flank pain-she was on IV Rocephin during this hospital stay.  Unfortunately-her urine cultures and blood cultures were negative.  Patient repeatedly given very frustrated with hospital stay-had numerous complaints-please see prior notes.  Since her cultures were negative, after discussion with radiology-an MRI of her kidneys were performed-to make sure she did not have a cyst  that bled recently causing her to have pain.  She did complete the MRI stay-however she left AGAINST MEDICAL ADVICE before the MRI report was formally obtained.  She left AMA late in the evening after this MD was no longer on-call.  Note-when she was hospitalized-she was contemplating leaving AMA-I had told her this was not recommended-and had advised her to remain hospitalized.  Asthma: Was stable-she was on as needed bronchodilators  Hypokalemia: This was repleted  UDS positive for cannabinoids: Plan was to counsel before discharge.   PERTINENT RADIOLOGIC STUDIES: DG Chest 2 View  Result Date: 03/15/2021 CLINICAL DATA:  Chest pain and shortness of breath EXAM: CHEST - 2 VIEW COMPARISON:  None. FINDINGS: Lungs are clear. Heart size and pulmonary vascularity are normal. No adenopathy. No pneumothorax. No bone lesions. IMPRESSION: Lungs clear.  Cardiac silhouette normal. Electronically Signed   By: Bretta Bang III M.D.   On: 03/15/2021 13:27   CT Angio Chest PE W/Cm &/Or Wo Cm  Result Date: 03/15/2021 CLINICAL DATA:  Shortness of breath, right abdominal pain, chest tightness and nausea for 1 day, history of asthma EXAM: CT ANGIOGRAPHY CHEST CT ABDOMEN AND PELVIS WITH CONTRAST TECHNIQUE: Multidetector CT imaging of the chest was performed using the standard protocol during bolus administration of intravenous contrast. Multiplanar CT image reconstructions and MIPs were obtained to evaluate the vascular anatomy. Multidetector CT imaging of the abdomen and pelvis was performed using the standard protocol during bolus administration of intravenous contrast. CONTRAST:  OMNIPAQUE IOHEXOL 350 MG/ML SOLN COMPARISON:  None. FINDINGS: CTA CHEST FINDINGS Cardiovascular: Satisfactory opacification the pulmonary arteries to the segmental level. No pulmonary artery filling defects are identified. Central pulmonary arteries are normal caliber. Normal heart size. No pericardial effusion. The aortic root is  suboptimally assessed given cardiac pulsation artifact. The aorta is  normal caliber. No acute luminal abnormality of the imaged aorta. No periaortic stranding or hemorrhage. Shared origin of the brachiocephalic and left common carotid arteries. Proximal great vessels are otherwise unremarkable. Mediastinum/Nodes: Wedge-shaped soft tissue attenuation in the anterior mediastinum, nonspecific though favored to reflect a thymic remnant in a patient of this age. No mediastinal fluid or gas. Normal thyroid gland and thoracic inlet. No acute abnormality of the trachea or esophagus. No worrisome mediastinal, hilar or axillary adenopathy. Lungs/Pleura: Mild airways thickening. No consolidation. No pneumothorax or visible effusion. No concerning pulmonary nodules or masses. Musculoskeletal: None Review of the MIP images confirms the above findings. CT ABDOMEN and PELVIS FINDINGS Hepatobiliary: No worrisome focal liver abnormality is seen. Normal gallbladder. No visible calcified gallstones. No biliary ductal dilatation. Pancreas: No pancreatic ductal dilatation or surrounding inflammatory changes. Spleen: Normal in size. No concerning splenic lesions. Adrenals/Urinary Tract: Normal adrenals. Abdomen and pelvis is imaged in the late nephrographic/excretory phase. Suspect some faint striations involving the upper pole right renal nephrogram with a wedge-shaped region of hypoattenuation and possible collection in the upper pole (6/52, 3/21) measuring up to 1.6 x 1.5 x 1.7 cm in size. No hydronephrosis. No urolithiasis. Urinary bladder is unremarkable for the degree of distention. Stomach/Bowel: Distal esophagus, stomach and duodenal sweep are unremarkable. No small bowel wall thickening or dilatation. No evidence of obstruction. A normal air-filled appendix is present in the right lower quadrant coursing in a retrocecal position from the cecal tip. No colonic dilatation or wall thickening. Vascular/Lymphatic: No significant  vascular findings are present. No enlarged abdominal or pelvic lymph nodes though challenging evaluation of the mesentery given a paucity of intraperitoneal fat. Reproductive: Anteverted uterus deviated slightly leftward. No concerning adnexal lesions. Peripherally enhancing probable corpus luteum in the right ovary. Other: No abdominopelvic free air or fluid. No bowel containing hernias. Musculoskeletal: No acute osseous abnormality or suspicious osseous lesion. Review of the MIP images confirms the above findings. IMPRESSION: No evidence of pulmonary artery embolism. Mild diffuse airways thickening, can reflect acute or chronic reactive airways disease versus bronchitis. Wedge-shaped region of hypoattenuation in the upper pole right kidney with some questionable striations of the nephrogram. Recommend correlation with urinalysis to assess for ascending tract infection and possible renal abscess given a slightly volume positive appearance of this focus as well as the presenting symptoms. Alternative etiologies could include incidental renal cyst or mass versus infarct. Could consider a dedicated renal ultrasound for further characterization. Wedge-shaped soft tissue attenuation anterior mediastinum, favor thymic remnant with Electronically Signed   By: Kreg Shropshire M.D.   On: 03/15/2021 22:15   MR ABDOMEN W WO CONTRAST  Result Date: 03/18/2021 CLINICAL DATA:  Right-sided abdominal pain. Right renal lesion on recent CT. EXAM: MRI ABDOMEN WITHOUT AND WITH CONTRAST TECHNIQUE: Multiplanar multisequence MR imaging of the abdomen was performed both before and after the administration of intravenous contrast. CONTRAST:  6mL GADAVIST GADOBUTROL 1 MMOL/ML IV SOLN COMPARISON:  None. FINDINGS: Lower chest: No acute findings. Hepatobiliary: No hepatic masses identified. Gallbladder is unremarkable. No evidence of biliary ductal dilatation. Pancreas:  No mass or inflammatory changes. Spleen:  Within normal limits in size  and appearance. Adrenals/Urinary Tract: Normal appearance of adrenal glands and left kidney. No evidence of hydronephrosis. An ill-defined lesion is seen in the upper pole of the right kidney which shows heterogeneous T2 hyperintensity and contrast enhancement. This measures 2.3 x 2.0 cm on image 13/7, without significant change in size or appearance since previous study. Mild edema is also  seen in the adjacent perinephric space. These findings are most consistent with pyelonephritis/early abscess, with neoplasm considered much less likely in this age group. Stomach/Bowel: Visualized portion unremarkable. Vascular/Lymphatic: No pathologically enlarged lymph nodes identified. No acute vascular findings. Other:  None. Musculoskeletal:  No suspicious bone lesions identified. IMPRESSION: Stable size of 2.3 cm lesion in upper pole of right kidney, with characteristics most consistent with pyelonephritis/early abscess. Neoplasm is considered very unlikely in this age group. Recommend continued follow-up by CT or MRI in 2-3 months. Electronically Signed   By: Danae Orleans M.D.   On: 03/18/2021 07:31   CT ABDOMEN PELVIS W CONTRAST  Result Date: 03/15/2021 CLINICAL DATA:  Shortness of breath, right abdominal pain, chest tightness and nausea for 1 day, history of asthma EXAM: CT ANGIOGRAPHY CHEST CT ABDOMEN AND PELVIS WITH CONTRAST TECHNIQUE: Multidetector CT imaging of the chest was performed using the standard protocol during bolus administration of intravenous contrast. Multiplanar CT image reconstructions and MIPs were obtained to evaluate the vascular anatomy. Multidetector CT imaging of the abdomen and pelvis was performed using the standard protocol during bolus administration of intravenous contrast. CONTRAST:  OMNIPAQUE IOHEXOL 350 MG/ML SOLN COMPARISON:  None. FINDINGS: CTA CHEST FINDINGS Cardiovascular: Satisfactory opacification the pulmonary arteries to the segmental level. No pulmonary artery filling  defects are identified. Central pulmonary arteries are normal caliber. Normal heart size. No pericardial effusion. The aortic root is suboptimally assessed given cardiac pulsation artifact. The aorta is normal caliber. No acute luminal abnormality of the imaged aorta. No periaortic stranding or hemorrhage. Shared origin of the brachiocephalic and left common carotid arteries. Proximal great vessels are otherwise unremarkable. Mediastinum/Nodes: Wedge-shaped soft tissue attenuation in the anterior mediastinum, nonspecific though favored to reflect a thymic remnant in a patient of this age. No mediastinal fluid or gas. Normal thyroid gland and thoracic inlet. No acute abnormality of the trachea or esophagus. No worrisome mediastinal, hilar or axillary adenopathy. Lungs/Pleura: Mild airways thickening. No consolidation. No pneumothorax or visible effusion. No concerning pulmonary nodules or masses. Musculoskeletal: None Review of the MIP images confirms the above findings. CT ABDOMEN and PELVIS FINDINGS Hepatobiliary: No worrisome focal liver abnormality is seen. Normal gallbladder. No visible calcified gallstones. No biliary ductal dilatation. Pancreas: No pancreatic ductal dilatation or surrounding inflammatory changes. Spleen: Normal in size. No concerning splenic lesions. Adrenals/Urinary Tract: Normal adrenals. Abdomen and pelvis is imaged in the late nephrographic/excretory phase. Suspect some faint striations involving the upper pole right renal nephrogram with a wedge-shaped region of hypoattenuation and possible collection in the upper pole (6/52, 3/21) measuring up to 1.6 x 1.5 x 1.7 cm in size. No hydronephrosis. No urolithiasis. Urinary bladder is unremarkable for the degree of distention. Stomach/Bowel: Distal esophagus, stomach and duodenal sweep are unremarkable. No small bowel wall thickening or dilatation. No evidence of obstruction. A normal air-filled appendix is present in the right lower quadrant  coursing in a retrocecal position from the cecal tip. No colonic dilatation or wall thickening. Vascular/Lymphatic: No significant vascular findings are present. No enlarged abdominal or pelvic lymph nodes though challenging evaluation of the mesentery given a paucity of intraperitoneal fat. Reproductive: Anteverted uterus deviated slightly leftward. No concerning adnexal lesions. Peripherally enhancing probable corpus luteum in the right ovary. Other: No abdominopelvic free air or fluid. No bowel containing hernias. Musculoskeletal: No acute osseous abnormality or suspicious osseous lesion. Review of the MIP images confirms the above findings. IMPRESSION: No evidence of pulmonary artery embolism. Mild diffuse airways thickening, can reflect acute or  chronic reactive airways disease versus bronchitis. Wedge-shaped region of hypoattenuation in the upper pole right kidney with some questionable striations of the nephrogram. Recommend correlation with urinalysis to assess for ascending tract infection and possible renal abscess given a slightly volume positive appearance of this focus as well as the presenting symptoms. Alternative etiologies could include incidental renal cyst or mass versus infarct. Could consider a dedicated renal ultrasound for further characterization. Wedge-shaped soft tissue attenuation anterior mediastinum, favor thymic remnant with Electronically Signed   By: Kreg Shropshire M.D.   On: 03/15/2021 22:15   US Abdomen Limited RUQ (LIVER/GB)  Result Date: 03/15/2021 CLINICAL DATA:  Right upper quadrant pain EXAM: ULTRASOUND ABDOMEN LIMITED RIGHT UPPER QUADRANT COMPARISON:  None. FINDINGS: Gallbladder: No gallstones or wall thickening visualized. No sonographic Murphy sign noted by sonographer. Common bile duct: Diameter: 2 mm, normal Liver: No focal lesion identified. Within normal limits in parenchymal echogenicity. Portal vein is patent on color Doppler imaging with normal direction of blood  flow towards the liver. Other: None. IMPRESSION: Normal examination. Electronically Signed   By: Burman Nieves M.D.   On: 03/15/2021 19:10     PERTINENT LAB RESULTS: CBC: No results for input(s): WBC, HGB, HCT, PLT in the last 72 hours. CMET CMP     Component Value Date/Time   NA 133 (L) 03/17/2021 0141   K 3.6 03/17/2021 0141   CL 104 03/17/2021 0141   CO2 20 (L) 03/17/2021 0141   GLUCOSE 112 (H) 03/17/2021 0141   BUN <5 (L) 03/17/2021 0141   CREATININE 0.68 03/17/2021 0141   CALCIUM 8.9 03/17/2021 0141   PROT 8.6 (H) 03/15/2021 1234   ALBUMIN 4.6 03/15/2021 1234   AST 25 03/15/2021 1234   ALT 19 03/15/2021 1234   ALKPHOS 58 03/15/2021 1234   BILITOT 1.2 03/15/2021 1234   GFRNONAA >60 03/17/2021 0141    GFR CrCl cannot be calculated (Unknown ideal weight.). No results for input(s): LIPASE, AMYLASE in the last 72 hours. No results for input(s): CKTOTAL, CKMB, CKMBINDEX, TROPONINI in the last 72 hours. Invalid input(s): POCBNP No results for input(s): DDIMER in the last 72 hours. No results for input(s): HGBA1C in the last 72 hours. No results for input(s): CHOL, HDL, LDLCALC, TRIG, CHOLHDL, LDLDIRECT in the last 72 hours. No results for input(s): TSH, T4TOTAL, T3FREE, THYROIDAB in the last 72 hours.  Invalid input(s): FREET3 No results for input(s): VITAMINB12, FOLATE, FERRITIN, TIBC, IRON, RETICCTPCT in the last 72 hours. Coags: No results for input(s): INR in the last 72 hours.  Invalid input(s): PT Microbiology: Recent Results (from the past 240 hour(s))  Resp Panel by RT-PCR (Flu A&B, Covid) Nasopharyngeal Swab     Status: None   Collection Time: 03/15/21  8:02 PM   Specimen: Nasopharyngeal Swab; Nasopharyngeal(NP) swabs in vial transport medium  Result Value Ref Range Status   SARS Coronavirus 2 by RT PCR NEGATIVE NEGATIVE Final    Comment: (NOTE) SARS-CoV-2 target nucleic acids are NOT DETECTED.  The SARS-CoV-2 RNA is generally detectable in upper  respiratory specimens during the acute phase of infection. The lowest concentration of SARS-CoV-2 viral copies this assay can detect is 138 copies/mL. A negative result does not preclude SARS-Cov-2 infection and should not be used as the sole basis for treatment or other patient management decisions. A negative result may occur with  improper specimen collection/handling, submission of specimen other than nasopharyngeal swab, presence of viral mutation(s) within the areas targeted by this assay, and inadequate number of viral  copies(<138 copies/mL). A negative result must be combined with clinical observations, patient history, and epidemiological information. The expected result is Negative.  Fact Sheet for Patients:  BloggerCourse.comhttps://www.fda.gov/media/152166/download  Fact Sheet for Healthcare Providers:  SeriousBroker.ithttps://www.fda.gov/media/152162/download  This test is no t yet approved or cleared by the [place] FDA and  has been authorized for detection and/or diagnosis of SARS-CoV-2 by FDA under an Emergency Use Authorization (EUA). This EUA will remain  in effect (meaning this test can be used) for the duration of the COVID-19 declaration under Section 564(b)(1) of the Act, 21 U.S.C.section 360bbb-3(b)(1), unless the authorization is terminated  or revoked sooner.       Influenza A by PCR NEGATIVE NEGATIVE Final   Influenza B by PCR NEGATIVE NEGATIVE Final    Comment: (NOTE) The Xpert Xpress SARS-CoV-2/FLU/RSV plus assay is intended as an aid in the diagnosis of influenza from Nasopharyngeal swab specimens and should not be used as a sole basis for treatment. Nasal washings and aspirates are unacceptable for Xpert Xpress SARS-CoV-2/FLU/RSV testing.  Fact Sheet for Patients: BloggerCourse.comhttps://www.fda.gov/media/152166/download  Fact Sheet for Healthcare Providers: SeriousBroker.ithttps://www.fda.gov/media/152162/download  This test is not yet approved or cleared by the [place] FDA and has been  authorized for detection and/or diagnosis of SARS-CoV-2 by FDA under an Emergency Use Authorization (EUA). This EUA will remain in effect (meaning this test can be used) for the duration of the COVID-19 declaration under Section 564(b)(1) of the Act, 21 U.S.C. section 360bbb-3(b)(1), unless the authorization is terminated or revoked.  Performed at [facility], [address] [place], [place] [address]   Blood culture (routine x 2)     Status: None   Collection Time: 03/15/21 11:05 PM   Specimen: BLOOD  Result Value Ref Range Status   Specimen Description BLOOD RIGHT ANTECUBITAL  Final   Special Requests   Final    BOTTLES DRAWN AEROBIC AND ANAEROBIC Blood Culture adequate volume   Culture   Final    NO GROWTH 5 DAYS Performed at [facility], [address] [place], [place] [address]    Report Status 03/21/2021 FINAL  Final  Blood culture (routine x 2)     Status: None   Collection Time: 03/15/21 11:10 PM   Specimen: BLOOD  Result Value Ref Range Status   Specimen Description BLOOD LEFT ANTECUBITAL  Final   Special Requests   Final    BOTTLES DRAWN AEROBIC AND ANAEROBIC Blood Culture adequate volume   Culture   Final    NO GROWTH 5 DAYS Performed at [facility], [address] [place], [place] [address]    Report Status 03/21/2021 FINAL  Final  Urine culture     Status: None   Collection Time: 03/15/21 11:25 PM   Specimen: Urine, Random  Result Value Ref Range Status   Specimen Description URINE, RANDOM  Final   Special Requests NONE  Final   Culture   Final    NO GROWTH Performed at [facility], [address] [place], [place] [address]    Report Status 03/17/2021 FINAL  Final  MRSA PCR Screening     Status: None   Collection Time: 03/16/21  4:44 AM  Result Value Ref Range Status   MRSA by PCR NEGATIVE NEGATIVE Final    Comment:        The GeneXpert MRSA Assay (FDA approved for NASAL specimens only), is one component of  a comprehensive MRSA colonization surveillance program. It is not  intended to diagnose MRSA infection nor to guide or monitor treatment for MRSA infections. Performed at [facility], [address], [place], [place] [address]        TODAY-DAY OF DISCHARGE:  Subjective:   Iliyana Convey today has signed out against medical advice. He was warned about the life threatening and life disabling effects by MD or RN.   Objective:   Blood pressure 129/85, pulse 84, temperature 98.7 F (37.1 C), temperature source Oral, resp. rate 19, last menstrual period 02/27/2021, SpO2 100 %.   DISCHARGE CONDITION: Not stable for discharge-left AMA  DISPOSITION: AMA   Follow with your PCP in 1 week   Total Time spent on discharge equals 25  minutes.  SignedJeoffrey Massed 03/23/2021 4:08 PM

## 2021-07-12 ENCOUNTER — Emergency Department (HOSPITAL_BASED_OUTPATIENT_CLINIC_OR_DEPARTMENT_OTHER)
Admission: EM | Admit: 2021-07-12 | Discharge: 2021-07-12 | Disposition: A | Discharge status: Home / Self Care | Payer: Commercial Managed Care - PPO | Attending: Emergency Medicine | Admitting: Emergency Medicine

## 2021-07-12 ENCOUNTER — Encounter (HOSPITAL_BASED_OUTPATIENT_CLINIC_OR_DEPARTMENT_OTHER): Payer: Self-pay | Admitting: Emergency Medicine

## 2021-07-12 ENCOUNTER — Emergency Department (HOSPITAL_BASED_OUTPATIENT_CLINIC_OR_DEPARTMENT_OTHER): Payer: Commercial Managed Care - PPO | Admitting: Radiology

## 2021-07-12 ENCOUNTER — Other Ambulatory Visit: Payer: Self-pay

## 2021-07-12 DIAGNOSIS — J45909 Unspecified asthma, uncomplicated: Secondary | ICD-10-CM | POA: Insufficient documentation

## 2021-07-12 DIAGNOSIS — R059 Cough, unspecified: Secondary | ICD-10-CM | POA: Diagnosis present

## 2021-07-12 DIAGNOSIS — J209 Acute bronchitis, unspecified: Secondary | ICD-10-CM | POA: Insufficient documentation

## 2021-07-12 DIAGNOSIS — J208 Acute bronchitis due to other specified organisms: Secondary | ICD-10-CM

## 2021-07-12 DIAGNOSIS — Z20822 Contact with and (suspected) exposure to covid-19: Secondary | ICD-10-CM | POA: Diagnosis not present

## 2021-07-12 LAB — RESP PANEL BY RT-PCR (FLU A&B, COVID) ARPGX2
Influenza A by PCR: NEGATIVE
Influenza B by PCR: NEGATIVE
SARS Coronavirus 2 by RT PCR: NEGATIVE

## 2021-07-12 MED ORDER — PREDNISONE 10 MG PO TABS: 40.0000 mg | ORAL_TABLET | Freq: Every day | ORAL | 0 refills | Status: DC

## 2021-07-12 MED ORDER — BENZONATATE 200 MG PO CAPS: 200.0000 mg | ORAL_CAPSULE | Freq: Three times a day (TID) | ORAL | 0 refills | Status: AC

## 2021-07-12 MED ORDER — ALBUTEROL SULFATE HFA 108 (90 BASE) MCG/ACT IN AERS: 2.0000 | INHALATION_SPRAY | RESPIRATORY_TRACT | Status: DC | PRN

## 2021-07-12 NOTE — ED Triage Notes (Signed)
Reports cold symptoms x 6 days , chills , cough , nasal congestion .

## 2021-07-12 NOTE — Discharge Instructions (Signed)
Use your inhaler, 1 to 2 puffs every 4-6 hours as needed as directed.  Take Tessalon as needed as prescribed for cough.  Take prednisone as prescribed and complete the full course.

## 2021-07-12 NOTE — ED Notes (Signed)
Patient arrives with cold-like symptoms. Patient has a hx of asthma and has an inhaler for this. Patient endorses using it at times, but does not like to use it. Patient has wheezing bilaterally during assessment. Patient is in no distress and feels it is "just a cold". RT will continue to monitor patient and respiratory needs throughout encounter.

## 2021-07-12 NOTE — ED Provider Notes (Signed)
[facility] EMERGENCY DEPT Provider Note   CSN: [id number] Arrival date & time: 07/12/21  1029     History Chief Complaint  Patient presents with   Cough    Cassidy Lawrence is a 20 y.o. female.  20 year old female with history of asthma presents with complaint of cough, wheezing, congestion x6 days.  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  Patient uses her inhaler at times however does not like the taste of the albuterol.  Reports max temp of 99.1 at home.  No other complaints or concerns.      Past Medical History:  Diagnosis Date   Asthma     Patient Active Problem List   Diagnosis Date Noted   Pyelonephritis 03/16/2021    History reviewed. No pertinent surgical history.   OB History   No obstetric history on file.     No family history on file.  Social History   Tobacco Use   Smoking status: Never   Smokeless tobacco: Never  Substance Use Topics   Alcohol use: Yes   Drug use: Yes    Types: Marijuana    Home Medications Prior to Admission medications   Medication Sig Start Date End Date Taking? Authorizing Provider  benzonatate (TESSALON) 200 MG capsule Take 1 capsule (200 mg total) by mouth every 8 (eight) hours for 10 days. 07/12/21 07/22/21 Yes Jeannie Fend, PA-C  predniSONE (DELTASONE) 10 MG tablet Take 4 tablets (40 mg total) by mouth daily for 5 days. 07/12/21 07/17/21 Yes Jeannie Fend, PA-C  albuterol (VENTOLIN HFA) 108 (90 Base) MCG/ACT inhaler Inhale 2-3 puffs into the lungs every 6 (six) hours as needed for wheezing or shortness of breath.    [provider]  fluticasone (FLONASE) 50 MCG/ACT nasal spray Place 2 sprays into both nostrils daily. Patient not taking: Reported on 03/16/2021 04/01/20 06/17/20  Rodriguez-Southworth, Nettie Elm, PA-C    Allergies    Patient has no known allergies.  Review of Systems   Review of Systems   Constitutional:  Negative for chills and fever.  HENT:  Positive for congestion. Negative for ear pain, sinus pressure, sinus pain and sore throat.   Eyes:  Negative for discharge and redness.  Respiratory:  Positive for cough, shortness of breath and wheezing.   Musculoskeletal:  Negative for arthralgias and myalgias.  Skin:  Negative for rash and wound.  Allergic/Immunologic: Negative for immunocompromised state.  Neurological:  Negative for weakness.  Hematological:  Negative for adenopathy.  Psychiatric/Behavioral:  Negative for confusion.   All other systems reviewed and are negative.  Physical Exam Updated Vital Signs BP 97/62 (BP Location: Right Arm)   Pulse 96   Temp 98.2 F (36.8 C) (Oral)   Resp 16   SpO2 100%   Physical Exam Vitals and nursing note reviewed.  Constitutional:      General: She is not in acute distress.    Appearance: She is well-developed. She is not diaphoretic.  HENT:     Head: Normocephalic and atraumatic.     Right Ear: Tympanic membrane and ear canal normal.     Left Ear: Tympanic membrane and ear canal normal.     Nose: Congestion present.     Mouth/Throat:     Mouth: Mucous membranes are moist.     Pharynx: No oropharyngeal exudate or posterior oropharyngeal erythema.  Eyes:     Conjunctiva/sclera: Conjunctivae normal.  Cardiovascular:     Rate and Rhythm: Normal rate and regular rhythm.  Pulmonary:     Effort: Pulmonary effort is normal.     Breath sounds: Wheezing present.  Musculoskeletal:     Cervical back: Neck supple. No tenderness.  Lymphadenopathy:     Cervical: No cervical adenopathy.  Skin:    General: Skin is warm and dry.     Findings: No erythema.  Neurological:     Mental Status: She is alert and oriented to person, place, and time.  Psychiatric:        Behavior: Behavior normal.    ED Results / Procedures / Treatments   Labs (all labs ordered are listed, but only abnormal results are displayed) Labs Reviewed   RESP PANEL BY RT-PCR (FLU A&B, COVID) [id number]    EKG None  Radiology DG Chest 2 View  Result Date: 07/12/2021 CLINICAL DATA:  Wheezing.  Chills and cough.  Nasal congestion. EXAM: CHEST - 2 VIEW COMPARISON:  03/15/2021 FINDINGS: Mild central airway thickening. The lungs appear otherwise clear. No blunting of the costophrenic angles. Cardiac and mediastinal margins appear normal. IMPRESSION: 1. Airway thickening is present, suggesting bronchitis or reactive airways disease. Electronically Signed   By: Gaylyn Rong M.D.   On: 07/12/2021 11:51    Procedures Procedures   Medications Ordered in ED Medications  albuterol (VENTOLIN HFA) 108 (90 Base) MCG/ACT inhaler 2 puff (has no administration in time range)    ED Course  I have reviewed the triage vital signs and the nursing notes.  Pertinent labs & imaging results that were available during my care of the patient were reviewed by me and considered in my medical decision making (see chart for details).  Clinical Course as of 07/12/21 1313  Wed Jul 12, 2021  6029 20 year old female with complaint of cough and congestion with wheezing as above.  On exam the patient is well-appearing.  She is found to have wheezing throughout, declines albuterol and treatment in the emergency room states she is not like the taste albuterol.  Vitals reviewed, she is satting 100% on room air.  She is afebrile.  Chest x-ray shows bronchitic changes.  COVID/flu pending.  We will start patient on prednisone, recommend that she continue to use her inhaler as prescribed.  Given Tessalon for her cough.  Given return precautions otherwise recheck with PCP or student health. [LM]    Clinical Course User Index [LM] Alden Hipp   MDM Rules/Calculators/A&P                           Final Clinical Impression(s) / ED Diagnoses Final diagnoses:  Acute bronchitis due to other specified organisms    Rx / DC Orders ED Discharge Orders           Ordered    predniSONE (DELTASONE) 10 MG tablet  Daily        07/12/21 1307    benzonatate (TESSALON) 200 MG capsule  Every 8 hours        07/12/21 1307             Jeannie Fend, PA-C 07/12/21 1313    Alvira Monday, MD 07/15/21 [phone]

## 2021-07-17 ENCOUNTER — Emergency Department (HOSPITAL_BASED_OUTPATIENT_CLINIC_OR_DEPARTMENT_OTHER)
Admission: EM | Admit: 2021-07-17 | Discharge: 2021-07-17 | Disposition: A | Discharge status: Home / Self Care | Payer: Commercial Managed Care - PPO | Attending: Emergency Medicine | Admitting: Emergency Medicine

## 2021-07-17 ENCOUNTER — Encounter (HOSPITAL_BASED_OUTPATIENT_CLINIC_OR_DEPARTMENT_OTHER): Payer: Self-pay

## 2021-07-17 ENCOUNTER — Other Ambulatory Visit: Payer: Self-pay

## 2021-07-17 DIAGNOSIS — R0602 Shortness of breath: Secondary | ICD-10-CM | POA: Diagnosis present

## 2021-07-17 DIAGNOSIS — J45909 Unspecified asthma, uncomplicated: Secondary | ICD-10-CM | POA: Insufficient documentation

## 2021-07-17 DIAGNOSIS — R059 Cough, unspecified: Secondary | ICD-10-CM | POA: Diagnosis not present

## 2021-07-17 MED ORDER — PREDNISONE 50 MG PO TABS: ORAL_TABLET | ORAL | 0 refills | Status: AC

## 2021-07-17 MED ORDER — PREDNISONE 50 MG PO TABS
60.0000 mg | ORAL_TABLET | Freq: Once | ORAL | Status: AC
  Administered 2021-07-17: 60 mg via ORAL

## 2021-07-17 MED ORDER — PREDNISONE 50 MG PO TABS
60.0000 mg | ORAL_TABLET | Freq: Every day | ORAL | Status: DC
  Filled 2021-07-17: qty 1

## 2021-07-17 NOTE — ED Provider Notes (Signed)
[facility] EMERGENCY DEPT Provider Note   CSN: [id number] Arrival date & time: 07/17/21  1352     History Chief Complaint  Patient presents with   Shortness of Breath   Cough    Cassidy Lawrence is a 20 y.o. female.  The history is provided by the patient. No language interpreter was used.  Shortness of Breath Severity:  Moderate Onset quality:  Gradual Duration:  6 days Timing:  Constant Progression:  Worsening Chronicity:  New Context: activity   Relieved by:  Nothing Worsened by:  Nothing Associated symptoms: cough   Cough Associated symptoms: shortness of breath       Past Medical History:  Diagnosis Date   Asthma     Patient Active Problem List   Diagnosis Date Noted   Pyelonephritis 03/16/2021    No past surgical history on file.   OB History   No obstetric history on file.     No family history on file.  Social History   Tobacco Use   Smoking status: Never   Smokeless tobacco: Never  Substance Use Topics   Alcohol use: Yes   Drug use: Yes    Types: Marijuana    Home Medications Prior to Admission medications   Medication Sig Start Date End Date Taking? Authorizing Provider  albuterol (VENTOLIN HFA) 108 (90 Base) MCG/ACT inhaler Inhale 2-3 puffs into the lungs every 6 (six) hours as needed for wheezing or shortness of breath.   Yes [provider]  predniSONE (DELTASONE) 50 MG tablet One tablet a day 07/17/21  Yes Elson Areas, PA-C  benzonatate (TESSALON) 200 MG capsule Take 1 capsule (200 mg total) by mouth every 8 (eight) hours for 10 days. 07/12/21 07/22/21  Jeannie Fend, PA-C  fluticasone (FLONASE) 50 MCG/ACT nasal spray Place 2 sprays into both nostrils daily. Patient not taking: Reported on 03/16/2021 04/01/20 06/17/20  Rodriguez-Southworth, Nettie Elm, PA-C    Allergies    Patient has no known allergies.  Review of Systems   Review of Systems  Respiratory:  Positive for cough and shortness of breath.    All other systems reviewed and are negative.  Physical Exam Updated Vital Signs BP 129/74 (BP Location: Right Arm)   Pulse 84   Temp 98.4 F (36.9 C) (Oral)   Resp 18   Ht 5\' 6"  (1.676 m)   Wt 61.2 kg   SpO2 100%   BMI 21.78 kg/m   Physical Exam Vitals and nursing note reviewed.  Constitutional:      Appearance: She is well-developed.  HENT:     Head: Normocephalic.     Mouth/Throat:     Mouth: Mucous membranes are moist.  Cardiovascular:     Rate and Rhythm: Normal rate and regular rhythm.  Pulmonary:     Effort: Pulmonary effort is normal.     Breath sounds: No decreased breath sounds or wheezing.  Abdominal:     General: There is no distension.  Musculoskeletal:        General: Normal range of motion.     Cervical back: Normal range of motion.  Neurological:     Mental Status: She is alert and oriented to person, place, and time.    ED Results / Procedures / Treatments   Labs (all labs ordered are listed, but only abnormal results are displayed) Labs Reviewed - No data to display  EKG None  Radiology No results found.  Procedures Procedures   Medications Ordered in ED Medications  predniSONE (DELTASONE)  tablet 60 mg (60 mg Oral Given 07/17/21 1659)    ED Course  I have reviewed the triage vital signs and the nursing notes.  Pertinent labs & imaging results that were available during my care of the patient were reviewed by me and considered in my medical decision making (see chart for details).    MDM Rules/Calculators/A&P                           MDM:  Pt counseled on asthma management.  Pt given prednisone here and rx for prednsione  Final Clinical Impression(s) / ED Diagnoses Final diagnoses:  None    Rx / DC Orders ED Discharge Orders          Ordered    predniSONE (DELTASONE) 50 MG tablet        07/17/21 1648          An After Visit Summary was printed and given to the patient.    Elson Areas, PA-C 07/17/21 2001     Rolan Bucco, MD 07/17/21 [phone]

## 2021-07-17 NOTE — ED Triage Notes (Signed)
Pt reports she was recently seen here and dx w/bronchitis. Pt reports while at work she felt like her throat was closing up, SOB and feeling light headed. Pt also reports using her inhaler 3 times with some relief. Pt denies completing the medications prescribed for her recent bronchitis. Pt speaking complete sentences, NAD

## 2021-07-28 ENCOUNTER — Encounter (HOSPITAL_COMMUNITY): Payer: Self-pay | Admitting: Emergency Medicine

## 2021-07-28 ENCOUNTER — Other Ambulatory Visit: Payer: Self-pay

## 2021-07-28 ENCOUNTER — Emergency Department (HOSPITAL_COMMUNITY)
Admission: EM | Admit: 2021-07-28 | Discharge: 2021-07-28 | Disposition: A | Discharge status: Home / Self Care | Payer: Commercial Managed Care - PPO | Attending: Emergency Medicine | Admitting: Emergency Medicine

## 2021-07-28 ENCOUNTER — Emergency Department (HOSPITAL_COMMUNITY): Payer: Commercial Managed Care - PPO

## 2021-07-28 DIAGNOSIS — J45909 Unspecified asthma, uncomplicated: Secondary | ICD-10-CM | POA: Diagnosis not present

## 2021-07-28 DIAGNOSIS — M25551 Pain in right hip: Secondary | ICD-10-CM | POA: Insufficient documentation

## 2021-07-28 DIAGNOSIS — M25521 Pain in right elbow: Secondary | ICD-10-CM | POA: Diagnosis not present

## 2021-07-28 DIAGNOSIS — Y92410 Unspecified street and highway as the place of occurrence of the external cause: Secondary | ICD-10-CM | POA: Diagnosis not present

## 2021-07-28 LAB — I-STAT BETA HCG BLOOD, ED (MC, WL, AP ONLY): I-stat hCG, quantitative: <5 m[IU]/mL (ref <5)

## 2021-07-28 MED ORDER — OXYCODONE HCL 5 MG PO TABS
5.0000 mg | ORAL_TABLET | Freq: Once | ORAL | Status: DC
Start: 2021-07-28 — End: 2021-07-28

## 2021-07-28 MED ORDER — ACETAMINOPHEN 500 MG PO TABS: 1000.0000 mg | ORAL_TABLET | Freq: Once | ORAL | Status: DC

## 2021-07-28 NOTE — Discharge Instructions (Signed)
Take 4 over the counter ibuprofen tablets 3 times a day or 2 over-the-counter naproxen tablets twice a day for pain. Also take tylenol 1000mg (2 extra strength) four times a day.   You will hurt worse tomorrow that is normal.  Please return for worsening confusion headache vomiting.

## 2021-07-28 NOTE — ED Notes (Signed)
Pt to xray
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
## 2021-07-28 NOTE — ED Notes (Signed)
Patient Alert and oriented to baseline. Stable and ambulatory to baseline. Patient verbalized understanding of the discharge instructions.  Patient belongings were taken by the patient.   

## 2021-07-28 NOTE — ED Notes (Signed)
RN assisted lab in waking pt.  Pt was difficult to arouse but did finally comply w/ request to position self to get blood taken.
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
